# Patient Record
Sex: Male | Born: 1945 | Race: White | Hispanic: No | Smoking: Current every day smoker
Health system: Southern US, Community
[De-identification: ages and names within clinical notes are randomized; demographics above are authoritative.]

## PROBLEM LIST (undated history)

## (undated) DIAGNOSIS — Z972 Presence of dental prosthetic device (complete) (partial): Secondary | ICD-10-CM

## (undated) DIAGNOSIS — K08109 Complete loss of teeth, unspecified cause, unspecified class: Secondary | ICD-10-CM

## (undated) DIAGNOSIS — R351 Nocturia: Secondary | ICD-10-CM

## (undated) DIAGNOSIS — C189 Malignant neoplasm of colon, unspecified: Secondary | ICD-10-CM

## (undated) DIAGNOSIS — N471 Phimosis: Secondary | ICD-10-CM

## (undated) DIAGNOSIS — E785 Hyperlipidemia, unspecified: Secondary | ICD-10-CM

## (undated) DIAGNOSIS — J439 Emphysema, unspecified: Secondary | ICD-10-CM

## (undated) DIAGNOSIS — R350 Frequency of micturition: Secondary | ICD-10-CM

## (undated) DIAGNOSIS — J302 Other seasonal allergic rhinitis: Secondary | ICD-10-CM

## (undated) HISTORY — PX: COLONOSCOPY: SHX174

## (undated) HISTORY — DX: Hyperlipidemia, unspecified: E78.5

---

## 1966-08-07 HISTORY — PX: WRIST SURGERY: SHX841

## 2005-06-29 ENCOUNTER — Ambulatory Visit (HOSPITAL_COMMUNITY): Admission: RE | Admit: 2005-06-29 | Discharge: 2005-06-29 | Payer: Self-pay | Admitting: Family Medicine

## 2009-02-27 ENCOUNTER — Inpatient Hospital Stay (HOSPITAL_COMMUNITY): Admission: EM | Admit: 2009-02-27 | Discharge: 2009-03-05 | Payer: Self-pay | Admitting: Emergency Medicine

## 2009-02-28 ENCOUNTER — Encounter (INDEPENDENT_AMBULATORY_CARE_PROVIDER_SITE_OTHER): Payer: Self-pay | Admitting: General Surgery

## 2009-02-28 HISTORY — PX: RIGHT COLECTOMY: SHX853

## 2009-03-07 ENCOUNTER — Emergency Department (HOSPITAL_COMMUNITY): Admission: EM | Admit: 2009-03-07 | Discharge: 2009-03-07 | Payer: Self-pay | Admitting: Emergency Medicine

## 2009-03-18 ENCOUNTER — Ambulatory Visit: Payer: Self-pay | Admitting: Oncology

## 2009-03-25 LAB — CBC WITH DIFFERENTIAL/PLATELET
Eosinophils Absolute: 0.3 10*3/uL (ref 0.0–0.5)
HCT: 34.6 % — ABNORMAL LOW (ref 38.4–49.9)
LYMPH%: 23 % (ref 14.0–49.0)
MCV: 87.1 fL (ref 79.3–98.0)
MONO%: 9.3 % (ref 0.0–14.0)
NEUT#: 3.3 10*3/uL (ref 1.5–6.5)
NEUT%: 61.7 % (ref 39.0–75.0)
Platelets: 307 10*3/uL (ref 140–400)
RBC: 3.98 10*6/uL — ABNORMAL LOW (ref 4.20–5.82)

## 2009-03-25 LAB — COMPREHENSIVE METABOLIC PANEL
Alkaline Phosphatase: 66 U/L (ref 39–117)
BUN: 12 mg/dL (ref 6–23)
Creatinine, Ser: 0.7 mg/dL (ref 0.40–1.50)
Glucose, Bld: 94 mg/dL (ref 70–99)
Sodium: 134 mEq/L — ABNORMAL LOW (ref 135–145)
Total Bilirubin: 0.4 mg/dL (ref 0.3–1.2)
Total Protein: 7 g/dL (ref 6.0–8.3)

## 2009-04-02 ENCOUNTER — Ambulatory Visit (HOSPITAL_COMMUNITY): Admission: RE | Admit: 2009-04-02 | Discharge: 2009-04-02 | Payer: Self-pay | Admitting: General Surgery

## 2009-04-02 HISTORY — PX: OTHER SURGICAL HISTORY: SHX169

## 2009-04-07 LAB — CBC WITH DIFFERENTIAL/PLATELET
BASO%: 0.2 % (ref 0.0–2.0)
HCT: 36.5 % — ABNORMAL LOW (ref 38.4–49.9)
MCHC: 34.5 g/dL (ref 32.0–36.0)
MONO#: 0.4 10*3/uL (ref 0.1–0.9)
RBC: 4.29 10*6/uL (ref 4.20–5.82)
WBC: 4.7 10*3/uL (ref 4.0–10.3)
lymph#: 1.3 10*3/uL (ref 0.9–3.3)

## 2009-04-17 ENCOUNTER — Ambulatory Visit: Payer: Self-pay | Admitting: Oncology

## 2009-04-21 LAB — COMPREHENSIVE METABOLIC PANEL
ALT: 11 U/L (ref 0–53)
CO2: 27 mEq/L (ref 19–32)
Calcium: 8.8 mg/dL (ref 8.4–10.5)
Chloride: 103 mEq/L (ref 96–112)
Potassium: 3.6 mEq/L (ref 3.5–5.3)
Sodium: 135 mEq/L (ref 135–145)
Total Protein: 6.7 g/dL (ref 6.0–8.3)

## 2009-04-21 LAB — CBC WITH DIFFERENTIAL/PLATELET
BASO%: 0.3 % (ref 0.0–2.0)
EOS%: 4.5 % (ref 0.0–7.0)
LYMPH%: 28.3 % (ref 14.0–49.0)
MCHC: 34.9 g/dL (ref 32.0–36.0)
MONO#: 0.5 10*3/uL (ref 0.1–0.9)
RBC: 4.42 10*6/uL (ref 4.20–5.82)
WBC: 4 10*3/uL (ref 4.0–10.3)
lymph#: 1.1 10*3/uL (ref 0.9–3.3)

## 2009-05-05 LAB — COMPREHENSIVE METABOLIC PANEL
ALT: 15 U/L (ref 0–53)
AST: 26 U/L (ref 0–37)
Alkaline Phosphatase: 57 U/L (ref 39–117)
Calcium: 9 mg/dL (ref 8.4–10.5)
Chloride: 102 mEq/L (ref 96–112)
Creatinine, Ser: 0.66 mg/dL (ref 0.40–1.50)
Potassium: 3.7 mEq/L (ref 3.5–5.3)

## 2009-05-05 LAB — CBC WITH DIFFERENTIAL/PLATELET
BASO%: 0.4 % (ref 0.0–2.0)
EOS%: 3.3 % (ref 0.0–7.0)
MCH: 30.8 pg (ref 27.2–33.4)
MCHC: 35.1 g/dL (ref 32.0–36.0)
MONO%: 14.6 % — ABNORMAL HIGH (ref 0.0–14.0)
NEUT%: 55.3 % (ref 39.0–75.0)
RDW: 15.6 % — ABNORMAL HIGH (ref 11.0–14.6)
lymph#: 1 10*3/uL (ref 0.9–3.3)

## 2009-05-15 ENCOUNTER — Ambulatory Visit: Payer: Self-pay | Admitting: Oncology

## 2009-05-19 LAB — CBC WITH DIFFERENTIAL/PLATELET
Basophils Absolute: 0 10*3/uL (ref 0.0–0.1)
EOS%: 3.5 % (ref 0.0–7.0)
MCH: 30.1 pg (ref 27.2–33.4)
MCHC: 34.5 g/dL (ref 32.0–36.0)
MCV: 87.3 fL (ref 79.3–98.0)
MONO%: 15.1 % — ABNORMAL HIGH (ref 0.0–14.0)
RBC: 4.25 10*6/uL (ref 4.20–5.82)
RDW: 16.1 % — ABNORMAL HIGH (ref 11.0–14.6)

## 2009-05-26 LAB — CBC WITH DIFFERENTIAL/PLATELET
BASO%: 0.6 % (ref 0.0–2.0)
Basophils Absolute: 0 10e3/uL (ref 0.0–0.1)
EOS%: 2.6 % (ref 0.0–7.0)
Eosinophils Absolute: 0.1 10e3/uL (ref 0.0–0.5)
HCT: 37.9 % — ABNORMAL LOW (ref 38.4–49.9)
HGB: 13.2 g/dL (ref 13.0–17.1)
LYMPH%: 35.7 % (ref 14.0–49.0)
MCH: 30.6 pg (ref 27.2–33.4)
MCHC: 34.8 g/dL (ref 32.0–36.0)
MCV: 87.9 fL (ref 79.3–98.0)
MONO#: 0.5 10e3/uL (ref 0.1–0.9)
MONO%: 15.5 % — ABNORMAL HIGH (ref 0.0–14.0)
NEUT#: 1.6 10e3/uL (ref 1.5–6.5)
NEUT%: 45.6 % (ref 39.0–75.0)
Platelets: 81 10e3/uL — ABNORMAL LOW (ref 140–400)
RBC: 4.31 10e6/uL (ref 4.20–5.82)
RDW: 16.5 % — ABNORMAL HIGH (ref 11.0–14.6)
WBC: 3.4 10e3/uL — ABNORMAL LOW (ref 4.0–10.3)
lymph#: 1.2 10e3/uL (ref 0.9–3.3)
nRBC: 0 % (ref 0–0)

## 2009-05-28 HISTORY — PX: PORTA CATH REMOVAL: CATH118286

## 2009-06-03 LAB — COMPREHENSIVE METABOLIC PANEL
AST: 19 U/L (ref 0–37)
Albumin: 3.6 g/dL (ref 3.5–5.2)
BUN: 7 mg/dL (ref 6–23)
Calcium: 9.3 mg/dL (ref 8.4–10.5)
Chloride: 106 mEq/L (ref 96–112)
Potassium: 3.8 mEq/L (ref 3.5–5.3)

## 2009-06-03 LAB — CBC WITH DIFFERENTIAL/PLATELET
BASO%: 0.2 % (ref 0.0–2.0)
Basophils Absolute: 0 10*3/uL (ref 0.0–0.1)
EOS%: 2.4 % (ref 0.0–7.0)
HCT: 38.8 % (ref 38.4–49.9)
HGB: 13.4 g/dL (ref 13.0–17.1)
MCH: 30.6 pg (ref 27.2–33.4)
MONO#: 0.6 10*3/uL (ref 0.1–0.9)
RDW: 15.7 % — ABNORMAL HIGH (ref 11.0–14.6)
WBC: 4.9 10*3/uL (ref 4.0–10.3)
lymph#: 1.1 10*3/uL (ref 0.9–3.3)

## 2009-06-16 ENCOUNTER — Ambulatory Visit: Payer: Self-pay | Admitting: Oncology

## 2009-06-16 LAB — CBC WITH DIFFERENTIAL/PLATELET
BASO%: 0.2 % (ref 0.0–2.0)
EOS%: 4.8 % (ref 0.0–7.0)
HCT: 38.5 % (ref 38.4–49.9)
LYMPH%: 30 % (ref 14.0–49.0)
MCH: 30.7 pg (ref 27.2–33.4)
MCHC: 34.8 g/dL (ref 32.0–36.0)
MCV: 88.3 fL (ref 79.3–98.0)
MONO%: 15.2 % — ABNORMAL HIGH (ref 0.0–14.0)
NEUT%: 49.8 % (ref 39.0–75.0)
Platelets: 89 10*3/uL — ABNORMAL LOW (ref 140–400)

## 2009-06-16 LAB — COMPREHENSIVE METABOLIC PANEL
ALT: 11 U/L (ref 0–53)
AST: 21 U/L (ref 0–37)
Calcium: 9 mg/dL (ref 8.4–10.5)
Chloride: 104 mEq/L (ref 96–112)
Creatinine, Ser: 0.67 mg/dL (ref 0.40–1.50)
Total Bilirubin: 0.4 mg/dL (ref 0.3–1.2)

## 2009-06-30 LAB — CBC WITH DIFFERENTIAL/PLATELET
Eosinophils Absolute: 0.1 10*3/uL (ref 0.0–0.5)
HCT: 38.9 % (ref 38.4–49.9)
LYMPH%: 21.4 % (ref 14.0–49.0)
MCHC: 35.2 g/dL (ref 32.0–36.0)
MCV: 89.4 fL (ref 79.3–98.0)
MONO#: 0.7 10*3/uL (ref 0.1–0.9)
MONO%: 14.5 % — ABNORMAL HIGH (ref 0.0–14.0)
NEUT%: 61.7 % (ref 39.0–75.0)
Platelets: 63 10*3/uL — ABNORMAL LOW (ref 140–400)
RBC: 4.35 10*6/uL (ref 4.20–5.82)
nRBC: 0 % (ref 0–0)

## 2009-06-30 LAB — COMPREHENSIVE METABOLIC PANEL
ALT: 16 U/L (ref 0–53)
BUN: 5 mg/dL — ABNORMAL LOW (ref 6–23)
CO2: 27 mEq/L (ref 19–32)
Calcium: 9.2 mg/dL (ref 8.4–10.5)
Chloride: 100 mEq/L (ref 96–112)
Creatinine, Ser: 0.63 mg/dL (ref 0.40–1.50)
Total Bilirubin: 0.6 mg/dL (ref 0.3–1.2)

## 2009-07-14 ENCOUNTER — Ambulatory Visit: Payer: Self-pay | Admitting: Oncology

## 2009-07-14 LAB — COMPREHENSIVE METABOLIC PANEL
AST: 25 U/L (ref 0–37)
Albumin: 3.8 g/dL (ref 3.5–5.2)
BUN: 6 mg/dL (ref 6–23)
CO2: 26 mEq/L (ref 19–32)
Calcium: 9.3 mg/dL (ref 8.4–10.5)
Chloride: 102 mEq/L (ref 96–112)
Creatinine, Ser: 0.63 mg/dL (ref 0.40–1.50)
Glucose, Bld: 86 mg/dL (ref 70–99)
Potassium: 3.9 mEq/L (ref 3.5–5.3)

## 2009-07-14 LAB — CBC WITH DIFFERENTIAL/PLATELET
BASO%: 0.3 % (ref 0.0–2.0)
Basophils Absolute: 0 10*3/uL (ref 0.0–0.1)
Eosinophils Absolute: 0.1 10*3/uL (ref 0.0–0.5)
HCT: 41 % (ref 38.4–49.9)
HGB: 14.2 g/dL (ref 13.0–17.1)
LYMPH%: 16.3 % (ref 14.0–49.0)
MCHC: 34.6 g/dL (ref 32.0–36.0)
MONO#: 0.8 10*3/uL (ref 0.1–0.9)
NEUT#: 5 10*3/uL (ref 1.5–6.5)
NEUT%: 70.8 % (ref 39.0–75.0)
Platelets: 74 10*3/uL — ABNORMAL LOW (ref 140–400)
WBC: 7 10*3/uL (ref 4.0–10.3)
lymph#: 1.1 10*3/uL (ref 0.9–3.3)

## 2009-07-28 LAB — COMPREHENSIVE METABOLIC PANEL
Albumin: 3.8 g/dL (ref 3.5–5.2)
BUN: 7 mg/dL (ref 6–23)
CO2: 27 mEq/L (ref 19–32)
Glucose, Bld: 98 mg/dL (ref 70–99)
Sodium: 134 mEq/L — ABNORMAL LOW (ref 135–145)
Total Bilirubin: 0.8 mg/dL (ref 0.3–1.2)
Total Protein: 6.9 g/dL (ref 6.0–8.3)

## 2009-07-28 LAB — CBC WITH DIFFERENTIAL/PLATELET
Eosinophils Absolute: 0.2 10*3/uL (ref 0.0–0.5)
HGB: 14.2 g/dL (ref 13.0–17.1)
MONO#: 0.7 10*3/uL (ref 0.1–0.9)
NEUT#: 2.6 10*3/uL (ref 1.5–6.5)
RBC: 4.38 10*6/uL (ref 4.20–5.82)
RDW: 16 % — ABNORMAL HIGH (ref 11.0–14.6)
WBC: 4.6 10*3/uL (ref 4.0–10.3)

## 2009-08-07 ENCOUNTER — Ambulatory Visit: Payer: Self-pay | Admitting: Oncology

## 2009-08-11 LAB — COMPREHENSIVE METABOLIC PANEL
CO2: 28 mEq/L (ref 19–32)
Creatinine, Ser: 0.74 mg/dL (ref 0.40–1.50)
Glucose, Bld: 111 mg/dL — ABNORMAL HIGH (ref 70–99)
Sodium: 135 mEq/L (ref 135–145)
Total Bilirubin: 0.7 mg/dL (ref 0.3–1.2)
Total Protein: 6.7 g/dL (ref 6.0–8.3)

## 2009-08-11 LAB — CBC WITH DIFFERENTIAL/PLATELET
Basophils Absolute: 0 10*3/uL (ref 0.0–0.1)
Eosinophils Absolute: 0.1 10*3/uL (ref 0.0–0.5)
HCT: 40.6 % (ref 38.4–49.9)
HGB: 14.1 g/dL (ref 13.0–17.1)
LYMPH%: 28.7 % (ref 14.0–49.0)
MONO#: 0.5 10*3/uL (ref 0.1–0.9)
NEUT#: 2.1 10*3/uL (ref 1.5–6.5)
NEUT%: 55.8 % (ref 39.0–75.0)
Platelets: 82 10*3/uL — ABNORMAL LOW (ref 140–400)
WBC: 3.8 10*3/uL — ABNORMAL LOW (ref 4.0–10.3)

## 2009-08-25 LAB — CBC WITH DIFFERENTIAL/PLATELET
Eosinophils Absolute: 0.1 10*3/uL (ref 0.0–0.5)
LYMPH%: 15 % (ref 14.0–49.0)
MONO#: 1 10*3/uL — ABNORMAL HIGH (ref 0.1–0.9)
NEUT#: 4.3 10*3/uL (ref 1.5–6.5)
Platelets: 104 10*3/uL — ABNORMAL LOW (ref 140–400)
RBC: 3.72 10*6/uL — ABNORMAL LOW (ref 4.20–5.82)
WBC: 6.4 10*3/uL (ref 4.0–10.3)
nRBC: 0 % (ref 0–0)

## 2009-08-25 LAB — COMPREHENSIVE METABOLIC PANEL
ALT: 11 U/L (ref 0–53)
Albumin: 3.2 g/dL — ABNORMAL LOW (ref 3.5–5.2)
CO2: 27 mEq/L (ref 19–32)
Calcium: 8.7 mg/dL (ref 8.4–10.5)
Chloride: 99 mEq/L (ref 96–112)
Creatinine, Ser: 0.58 mg/dL (ref 0.40–1.50)
Potassium: 3.3 mEq/L — ABNORMAL LOW (ref 3.5–5.3)
Sodium: 133 mEq/L — ABNORMAL LOW (ref 135–145)
Total Protein: 7 g/dL (ref 6.0–8.3)

## 2009-09-08 ENCOUNTER — Ambulatory Visit: Payer: Self-pay | Admitting: Oncology

## 2009-09-08 LAB — CBC WITH DIFFERENTIAL/PLATELET
Basophils Absolute: 0 10*3/uL (ref 0.0–0.1)
Eosinophils Absolute: 0.1 10*3/uL (ref 0.0–0.5)
HCT: 39.5 % (ref 38.4–49.9)
LYMPH%: 15.9 % (ref 14.0–49.0)
MCV: 94.7 fL (ref 79.3–98.0)
MONO#: 1 10*3/uL — ABNORMAL HIGH (ref 0.1–0.9)
MONO%: 15 % — ABNORMAL HIGH (ref 0.0–14.0)
NEUT#: 4.6 10*3/uL (ref 1.5–6.5)
NEUT%: 67.4 % (ref 39.0–75.0)
Platelets: 112 10*3/uL — ABNORMAL LOW (ref 140–400)
RBC: 4.17 10*6/uL — ABNORMAL LOW (ref 4.20–5.82)
WBC: 6.8 10*3/uL (ref 4.0–10.3)

## 2009-09-08 LAB — COMPREHENSIVE METABOLIC PANEL
Alkaline Phosphatase: 77 U/L (ref 39–117)
CO2: 28 mEq/L (ref 19–32)
Creatinine, Ser: 0.67 mg/dL (ref 0.40–1.50)
Glucose, Bld: 98 mg/dL (ref 70–99)
Sodium: 130 mEq/L — ABNORMAL LOW (ref 135–145)
Total Bilirubin: 0.8 mg/dL (ref 0.3–1.2)
Total Protein: 7.2 g/dL (ref 6.0–8.3)

## 2009-09-22 LAB — CBC WITH DIFFERENTIAL/PLATELET
BASO%: 0.4 % (ref 0.0–2.0)
EOS%: 3.9 % (ref 0.0–7.0)
HCT: 39.1 % (ref 38.4–49.9)
LYMPH%: 21.5 % (ref 14.0–49.0)
MCH: 32.9 pg (ref 27.2–33.4)
MCHC: 34.3 g/dL (ref 32.0–36.0)
MCV: 96.1 fL (ref 79.3–98.0)
MONO#: 0.5 10*3/uL (ref 0.1–0.9)
MONO%: 8.8 % (ref 0.0–14.0)
NEUT%: 65.4 % (ref 39.0–75.0)
Platelets: 93 10*3/uL — ABNORMAL LOW (ref 140–400)
RBC: 4.07 10*6/uL — ABNORMAL LOW (ref 4.20–5.82)

## 2009-09-22 LAB — COMPREHENSIVE METABOLIC PANEL
ALT: 9 U/L (ref 0–53)
Alkaline Phosphatase: 77 U/L (ref 39–117)
CO2: 27 mEq/L (ref 19–32)
Creatinine, Ser: 0.64 mg/dL (ref 0.40–1.50)
Sodium: 134 mEq/L — ABNORMAL LOW (ref 135–145)
Total Bilirubin: 0.7 mg/dL (ref 0.3–1.2)

## 2009-10-30 ENCOUNTER — Ambulatory Visit: Payer: Self-pay | Admitting: Oncology

## 2009-11-03 LAB — CBC WITH DIFFERENTIAL/PLATELET
BASO%: 0.8 % (ref 0.0–2.0)
EOS%: 2.8 % (ref 0.0–7.0)
MCH: 33.6 pg — ABNORMAL HIGH (ref 27.2–33.4)
MCHC: 35 g/dL (ref 32.0–36.0)
RDW: 14.1 % (ref 11.0–14.6)
lymph#: 1.2 10*3/uL (ref 0.9–3.3)

## 2009-11-11 ENCOUNTER — Encounter: Admission: RE | Admit: 2009-11-11 | Discharge: 2009-11-11 | Payer: Self-pay | Admitting: General Surgery

## 2010-02-20 ENCOUNTER — Ambulatory Visit: Payer: Self-pay | Admitting: Oncology

## 2010-02-24 ENCOUNTER — Ambulatory Visit (HOSPITAL_COMMUNITY): Admission: RE | Admit: 2010-02-24 | Discharge: 2010-02-24 | Payer: Self-pay | Admitting: Oncology

## 2010-02-24 LAB — CBC WITH DIFFERENTIAL/PLATELET
BASO%: 1 % (ref 0.0–2.0)
Eosinophils Absolute: 0.1 10*3/uL (ref 0.0–0.5)
HCT: 42.2 % (ref 38.4–49.9)
HGB: 15 g/dL (ref 13.0–17.1)
MCHC: 35.5 g/dL (ref 32.0–36.0)
MONO#: 0.4 10*3/uL (ref 0.1–0.9)
NEUT#: 3 10*3/uL (ref 1.5–6.5)
NEUT%: 64.5 % (ref 39.0–75.0)
WBC: 4.6 10*3/uL (ref 4.0–10.3)
lymph#: 1.1 10*3/uL (ref 0.9–3.3)

## 2010-02-24 LAB — COMPREHENSIVE METABOLIC PANEL
ALT: 10 U/L (ref 0–53)
CO2: 28 mEq/L (ref 19–32)
Calcium: 9.1 mg/dL (ref 8.4–10.5)
Chloride: 103 mEq/L (ref 96–112)
Creatinine, Ser: 0.86 mg/dL (ref 0.40–1.50)
Total Protein: 7.4 g/dL (ref 6.0–8.3)

## 2010-02-24 LAB — CEA: CEA: 2.7 ng/mL (ref 0.0–5.0)

## 2010-04-17 ENCOUNTER — Encounter (INDEPENDENT_AMBULATORY_CARE_PROVIDER_SITE_OTHER): Payer: Self-pay | Admitting: *Deleted

## 2010-04-20 ENCOUNTER — Ambulatory Visit: Payer: Self-pay | Admitting: Gastroenterology

## 2010-05-04 ENCOUNTER — Ambulatory Visit: Payer: Self-pay | Admitting: Gastroenterology

## 2010-05-06 ENCOUNTER — Encounter: Payer: Self-pay | Admitting: Gastroenterology

## 2010-08-16 IMAGING — CT CT PELVIS W/ CM
1 of 2 series · 14 of 32 positions shown, 18 images · IV contrast (agent unspecified)
Comparison: Acute abdominal series 02/27/2009

CT ABDOMEN

CLINICAL DATA: Small bowel obstruction and abdominal pain

CT ABDOMEN AND PELVIS WITH CONTRAST
TECHNIQUE: Multidetector CT imaging of the abdomen and pelvis was
performed using the standard protocol following bolus
administration of intravenous contrast.
Contrast: 100 ml Umnipaque-KAA

[Series 2: rtn ap with st · axial · 0.67mm/px · z∈[-540,-110]mm · 14 of 96 slices shown, 18 images]
[im 5/96  soft-tissue]
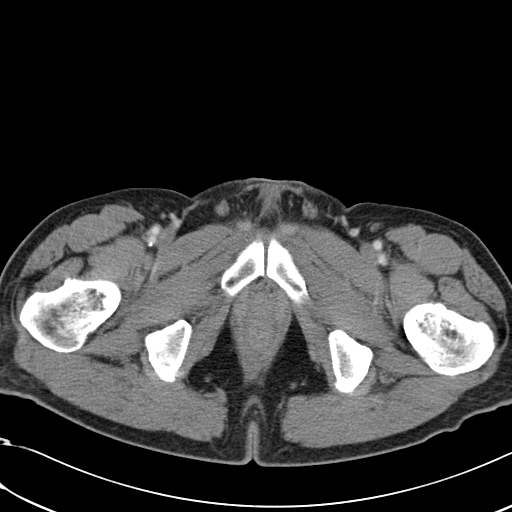
[im 5/96  bone]
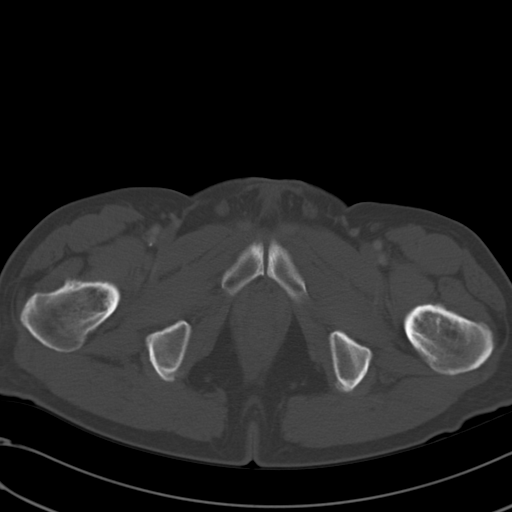
[im 13/96  soft-tissue]
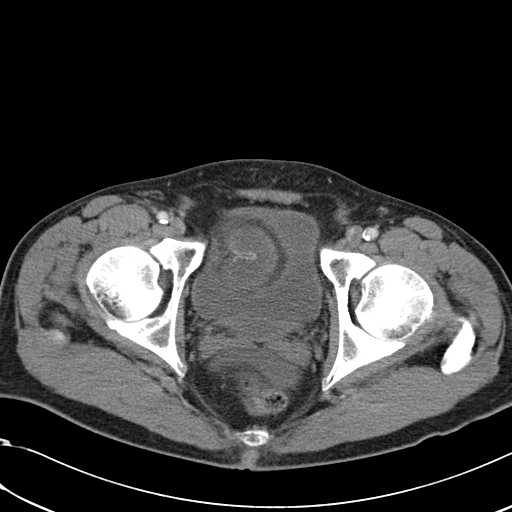
[im 22/96  soft-tissue]
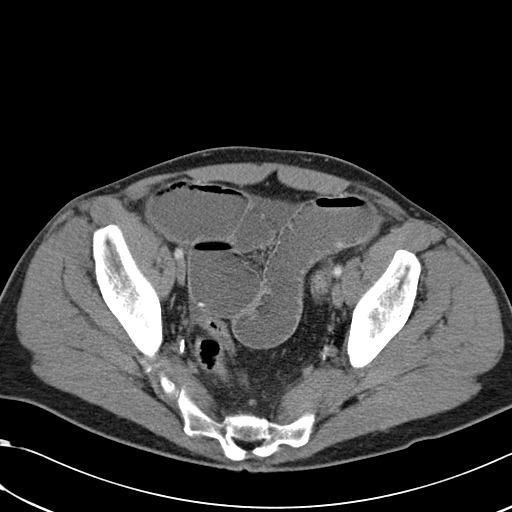
[im 31/96  soft-tissue]
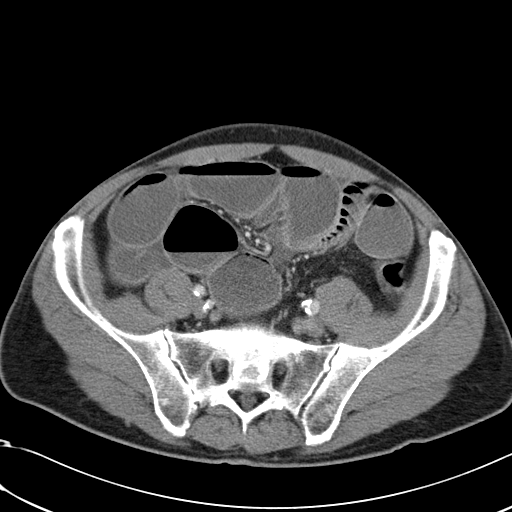
[im 35/96  soft-tissue]
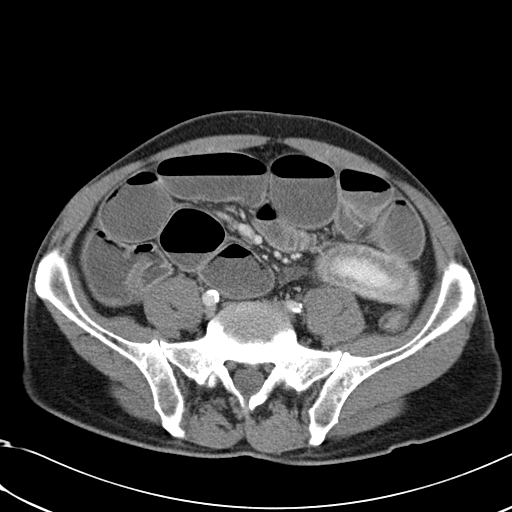
[im 44/96  soft-tissue]
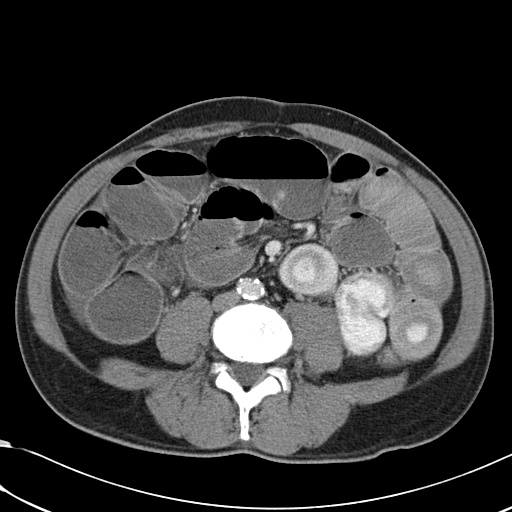
[im 52/96  soft-tissue]
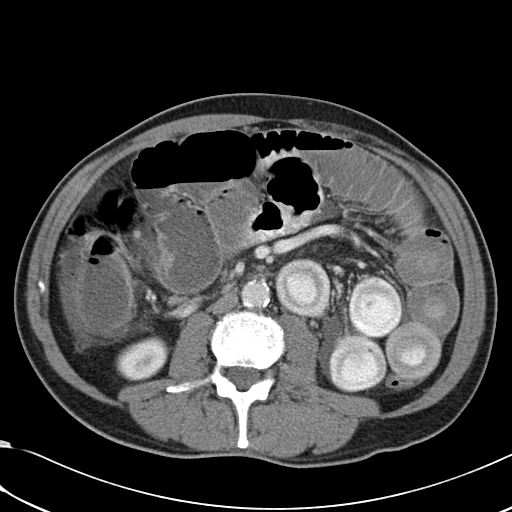
[im 61/96  soft-tissue]
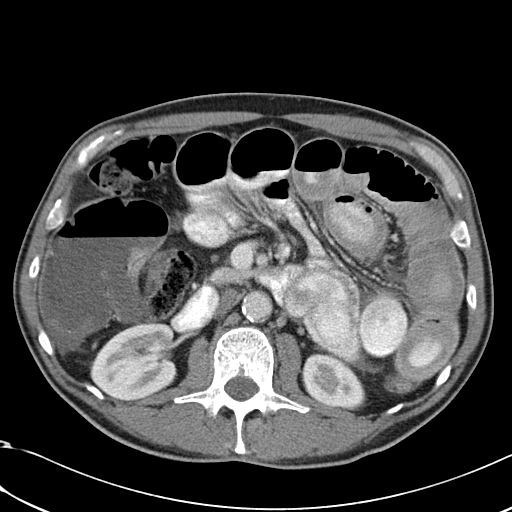
[im 65/96  soft-tissue]
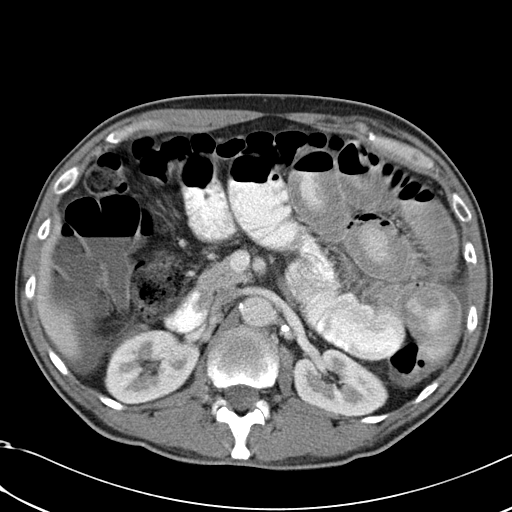
[im 65/96  bone]
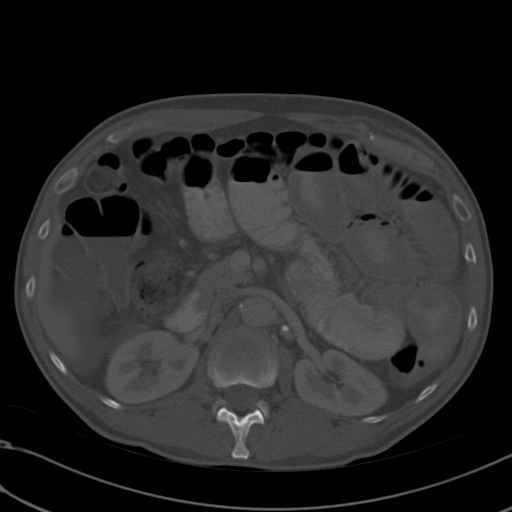
[im 74/96  soft-tissue]
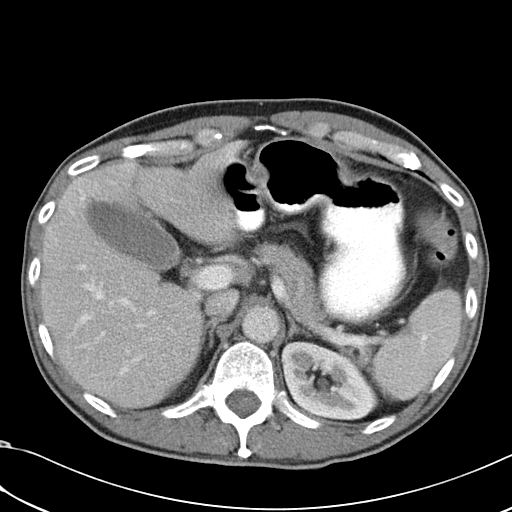
[im 78/96  lung]
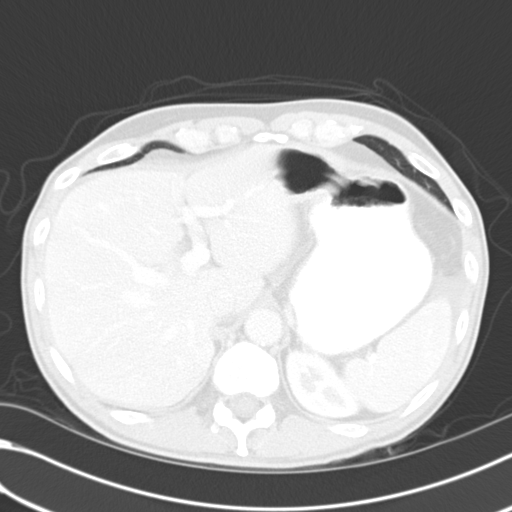
[im 83/96  soft-tissue]
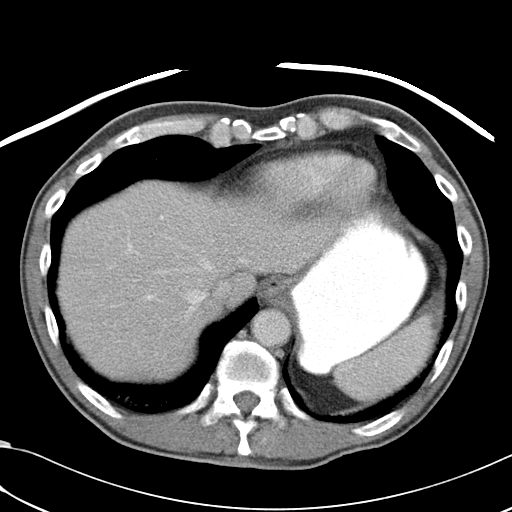
[im 83/96  lung]
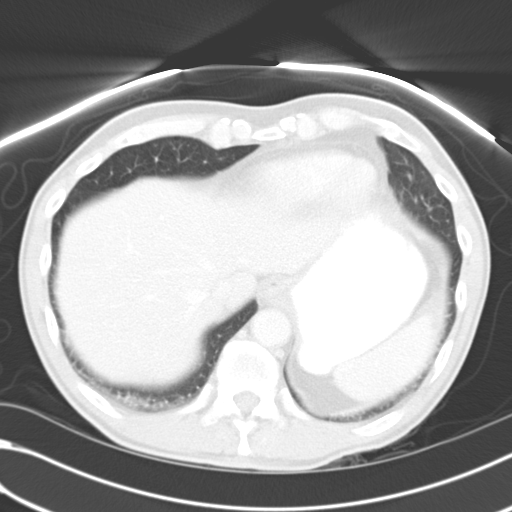
[im 87/96  lung]
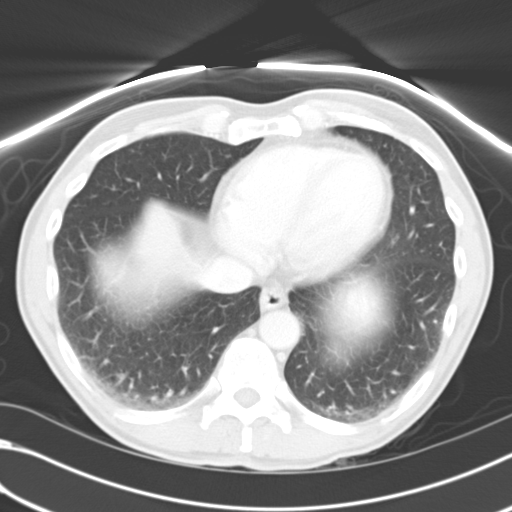
[im 91/96  soft-tissue]
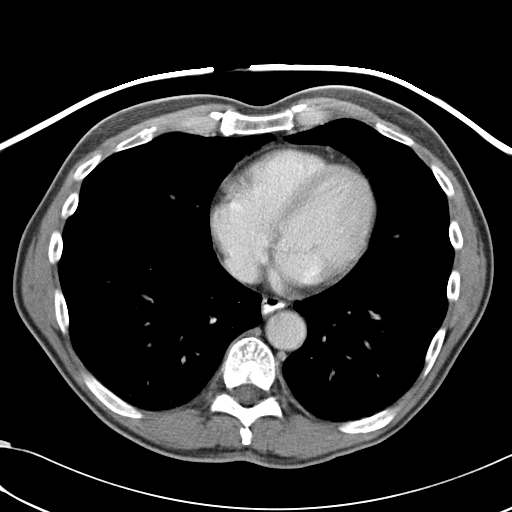
[im 91/96  lung]
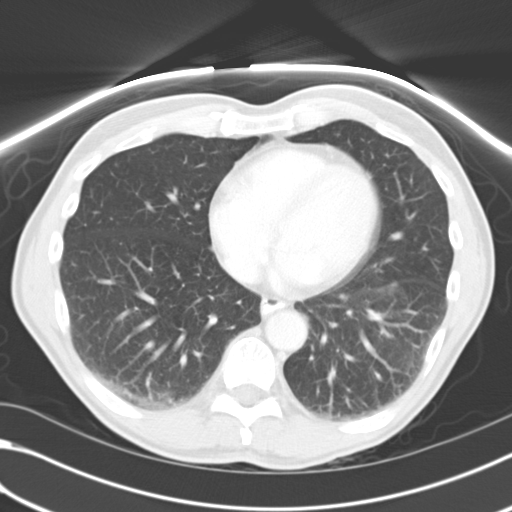

[14 of 32 positions shown; findings below may reference images not displayed]

FINDINGS: Heart size is normal.  There is dependent atelectasis in
the lung bases.  No pleural effusion or pulmonary nodule is
identified at the lung bases.

There is a high-grade small bowel and proximal colonic obstruction,
with a transition point in the ascending colon near the hepatic
flexure where there is a concentric mass measuring approximately
2.2 x 2.5 cm axial diameter, highly concerning for colon carcinoma.
Distal to this mass, the colon is within normal limits for caliber.
Small bowel loops are fluid-filled and measure up to 4.6 cm in the
right abdomen.  The cecum and dilated portion of the ascending
colon are fluid filled.

In the mesenteries of the upper abdomen, medial to the colonic mass
is 95 mm lymph node (image #36 of series 2).  Just medial to this,
and adjacent to the superior mesenteric vein is a 5.5 mm lymph node
(image #37 of series 2).  Several small lymph nodes are also
identified in the right abdomen, inferior to the colonic mass where
there are is a 4 mm lymph node (image #44 of series 2 )and a 5 mm
lymph node image #45 of series 2).  These may be reactive, or may
be involved with metastatic disease.  There is a small amount of
ascites in the right upper quadrant adjacent to the liver, and in
Morison's pouch.

No enlarged retroperitoneal lymph nodes are identified.

The liver, gallbladder, pancreas, spleen, and kidneys are within
normal limits.  There is no hydronephrosis.  On delayed images both
kidneys excrete contrast symmetrically.  The

No lytic or sclerotic osseous lesions are identified.
IMPRESSION: 1.  High-grade small bowel and proximal colonic obstruction,
secondary to a concentric colonic mass in the ascending colon,
highly worrisome for colon carcinoma.  Findings were discussed by
telephone with Dr. Rajpoot.
2.  There are multiple small (4-6 mm) lymph nodes within the
mesenteries of the right abdomen for which metastatic involvement
cannot be excluded.  Alternatively, these could be reactive lymph
nodes.
3.  Small amount of ascites.

CT PELVIS
FINDINGS: There markedly dilated pelvic small bowel loops,
measuring up to 4.2 cm.  No evidence of small bowel wall
thickening.  The sigmoid colon and rectum are decompressed.  There
is a small volume of abdominal pelvic ascites.  Prostate gland is
mildly prominent.  Seminal vesicles unremarkable.  No inguinal or
pelvic sidewall lymphadenopathy is identified.  The urinary bladder
is unremarkable.  No lytic or sclerotic pelvic bony lesions.
IMPRESSION: Dilated pelvic small bowel loops consistent with high-grade bowel
obstruction as described above.
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
## 2010-08-25 ENCOUNTER — Ambulatory Visit: Payer: Self-pay | Admitting: Oncology

## 2010-08-27 ENCOUNTER — Encounter: Payer: Self-pay | Admitting: Gastroenterology

## 2010-08-27 LAB — CBC WITH DIFFERENTIAL/PLATELET
Basophils Absolute: 0 10*3/uL (ref 0.0–0.1)
EOS%: 2 % (ref 0.0–7.0)
Eosinophils Absolute: 0.1 10*3/uL (ref 0.0–0.5)
HGB: 15.4 g/dL (ref 13.0–17.1)
NEUT#: 4.6 10*3/uL (ref 1.5–6.5)
RDW: 13 % (ref 11.0–14.6)
lymph#: 1.2 10*3/uL (ref 0.9–3.3)

## 2010-10-16 ENCOUNTER — Other Ambulatory Visit: Payer: Self-pay | Admitting: Oncology

## 2010-10-16 DIAGNOSIS — C189 Malignant neoplasm of colon, unspecified: Secondary | ICD-10-CM

## 2010-10-27 NOTE — Letter (Signed)
Summary: Results Letter  [facility]  [address] [place], [place] [address]   Phone: [phone]  Fax: [phone]        May 06, 2010 MRN: [id number]    [facility] [address] [place], [place]  [address]    Dear Mr. Crisoforo Oxford,   Good news.  The polyp(s) that were removed during your recent procedure were NOT pre-cancerous.  However given your previous colon cancer, you will need a repeat colonoscopy in 3 years.  We will therefore put your information in our reminder system and will contact you in 3 years to schedule a repeat procedure.  Please call if you have any questions or concerns.        Sincerely,  Rachael Fee MD  This letter has been electronically signed by your physician.  Appended Document: Results Letter letter mailed

## 2010-10-27 NOTE — Letter (Signed)
Summary: [facility] Instructions  [facility]  [address] [place], [place] [address]   Phone: [phone]  Fax: [phone]       Tim Obrien    10-May-1946    MRN: [id number]        Procedure Day /Date:  05/04/10 Monday     Arrival Time:  8:30am      Procedure Time:  9:30am     Location of Procedure:                    _ x_   [facility]oscopy Center (4th Floor)                        PREPARATION FOR COLONOSCOPY WITH MOVIPREP   Starting 5 days prior to your procedure _ 8/3/11_ do not eat nuts, seeds, popcorn, corn, beans, peas,  salads, or any raw vegetables.  Do not take any fiber supplements (e.g. Metamucil, Citrucel, and Benefiber).  THE DAY BEFORE YOUR PROCEDURE         DATE:   05/03/10  DAY:  Sunday  1.  Drink clear liquids the entire day-NO SOLID FOOD  2.  Do not drink anything colored red or purple.  Avoid juices with pulp.  No orange juice.  3.  Drink at least 64 oz. (8 glasses) of fluid/clear liquids during the day to prevent dehydration and help the prep work efficiently.  CLEAR LIQUIDS INCLUDE: Water Jello Ice Popsicles Tea (sugar ok, no milk/cream) Powdered fruit flavored drinks Coffee (sugar ok, no milk/cream) Gatorade Juice: apple, white grape, white cranberry  Lemonade Clear bullion, consomm, broth Carbonated beverages (any kind) Strained chicken noodle soup Hard Candy                             4.  In the morning, mix first dose of MoviPrep solution:    Empty 1 Pouch A and 1 Pouch B into the disposable container    Add lukewarm drinking water to the top line of the container. Mix to dissolve    Refrigerate (mixed solution should be used within 24 hrs)  5.  Begin drinking the prep at 5:00 p.m. The MoviPrep container is divided by 4 marks.   Every 15 minutes drink the solution down to the next mark (approximately 8 oz) until the full liter is complete.   6.  Follow completed prep with 16 oz of clear liquid of your choice  (Nothing red or purple).  Continue to drink clear liquids until bedtime.  7.  Before going to bed, mix second dose of MoviPrep solution:    Empty 1 Pouch A and 1 Pouch B into the disposable container    Add lukewarm drinking water to the top line of the container. Mix to dissolve    Refrigerate  THE DAY OF YOUR PROCEDURE      DATE:  05/04/10 DAY:  Monday  Beginning at  4:30 a.m. (5 hours before procedure):         1. Every 15 minutes, drink the solution down to the next mark (approx 8 oz) until the full liter is complete.  2. Follow completed prep with 16 oz. of clear liquid of your choice.    3. You may drink clear liquids until  7:30am  (2 HOURS BEFORE PROCEDURE).   MEDICATION INSTRUCTIONS  Unless otherwise instructed, you should take regular prescription medications with a small sip of  water   as early as possible the morning of your procedure.           OTHER INSTRUCTIONS  You will need a responsible adult at least 64 years of age to accompany you and drive you home.   This person must remain in the waiting room during your procedure.  Wear loose fitting clothing that is easily removed.  Leave jewelry and other valuables at home.  However, you may wish to bring a book to read or  an iPod/MP3 player to listen to music as you wait for your procedure to start.  Remove all body piercing jewelry and leave at home.  Total time from sign-in until discharge is approximately 2-3 hours.  You should go home directly after your procedure and rest.  You can resume normal activities the  day after your procedure.  The day of your procedure you should not:   Drive   Make legal decisions   Operate machinery   Drink alcohol   Return to work  You will receive specific instructions about eating, activities and medications before you leave.    The above instructions have been reviewed and explained to me by   Clide Cliff, RN_______________________    I fully  understand and can verbalize these instructions _____________________________ Date _________

## 2010-10-27 NOTE — Procedures (Signed)
Summary: Colonoscopy  Patient: Tim Obrien Note: All result statuses are Final unless otherwise noted.  Tests: (1) Colonoscopy (COL)   COL Colonoscopy           DONE     [facility]     [address]     [place], [place]  [address]           COLONOSCOPY PROCEDURE REPORT           PATIENT:  [name], [name]  MR#:  [id number]     BIRTHDATE:  1946-09-15, 64 yrs. old  GENDER:  male     ENDOSCOPIST:  Rachael Fee, MD     REF. BY:  Lavada Mesi. Truett Perna, M.D.     PROCEDURE DATE:  05/04/2010     PROCEDURE:  Colonoscopy with biopsy and snare polypectomy     ASA CLASS:  Class II     INDICATIONS:  T4 node +, right sided colon cancer resected June,     2010 by Dr. Dwain Sarna; chemo completed December 2010     MEDICATIONS:   Fentanyl 50 mcg IV, Versed 5 mg IV           DESCRIPTION OF PROCEDURE:   After the risks benefits and     alternatives of the procedure were thoroughly explained, informed     consent was obtained.  Digital rectal exam was performed and     revealed no rectal masses.   The LB PCF-H180AL [id number] endoscope     was introduced through the anus and advanced to the anastomosis,     without limitations.  The quality of the prep was good, using     MoviPrep.  The instrument was then slowly withdrawn as the colon     was fully examined.     <<PROCEDUREIMAGES>>           FINDINGS:  There were multiple small sessile polyps removed from     colon. These ranged in size from 3mm to 5mm. Many of them appeared     hyperplastic. They were removed with cold snare or biopsy forceps.     These were sent in 2 pathology jars (jar 1 contained 9 polyps, jar     2 contained a sampling of several small recto-sigmoid polyps that     were sampled) (see image5, image6, and image9).  Right     hemicolectomy anastomosis appeared normal (see image2 and image4).     This was otherwise a normal examination of the colon (see     image10).   Retroflexed views in the rectum revealed no  abnormalities.    The scope was then withdrawn from the patient     and the procedure completed.           COMPLICATIONS:  None     ENDOSCOPIC IMPRESSION:     1) Multiple small polyps, removed and sent to pathology.  Many     appeared hyperplastic     2) Normal right hemicolectomy     3) Otherwise normal examination           RECOMMENDATIONS:     1) You will receive a letter within 1-2 weeks with the results     of your biopsy as well as final recommendations. Please call my     office if you have not received a letter after 3 weeks.     2) Given your personal history of colon cancer, you will at least  need a colonoscopy in 3 years.           ______________________________     Rachael Fee, MD           cc: Harden Mo, MD           n.     eSIGNED:   Rachael Fee at 05/04/2010 10:05 AM           Jerelene Redden, [id number]  Note: An exclamation mark (!) indicates a result that was not dispersed into the flowsheet. Document Creation Date: 05/04/2010 10:07 AM _______________________________________________________________________  (1) Order result status: Final Collection or observation date-time: 05/04/2010 09:53 Requested date-time:  Receipt date-time:  Reported date-time:  Referring Physician:   Ordering Physician: Rob Bunting [phone]) Specimen Source:  Source: Launa Grill Order Number: [id number] Lab site:   Appended Document: Colonoscopy recall     Procedures Next Due Date:    Colonoscopy: 04/2013

## 2010-10-27 NOTE — Miscellaneous (Signed)
Summary: DIR COL...AS.  Clinical Lists Changes  Medications: Added new medication of MOVIPREP 100 GM  SOLR (PEG-KCL-NACL-NASULF-NA ASC-C) As directed - Signed Rx of MOVIPREP 100 GM  SOLR (PEG-KCL-NACL-NASULF-NA ASC-C) As directed;  #1 x 0;  Signed;  Entered by: Clide Cliff RN;  Authorized by: Rachael Fee MD;  Method used: [facility] [phone]*, [address] [place], [place], [place]  [address], Ph: [phone], Fax: [phone] Allergies: Added new allergy or adverse reaction of ASA Observations: Added new observation of NKA: F (04/20/2010 7:46)    Prescriptions: MOVIPREP 100 GM  SOLR (PEG-KCL-NACL-NASULF-NA ASC-C) As directed  #1 x 0   Entered by:   Clide Cliff RN   Authorized by:   Rachael Fee MD   Signed by:   Clide Cliff RN on 04/20/2010   Method used:   [facility] to        [facility] [phone][facility] (retail)       [address]       [place], [place]  [address]       Ph: [phone]       Fax: [phone]   RxID:   [id number]

## 2010-10-29 NOTE — Letter (Signed)
Summary: [facility]  [facility]   Imported By: Lester [place] 09/09/2010 09:02:19  _____________________________________________________________________  External Attachment:    Type:   Image     Comment:   External Document

## 2011-01-03 LAB — BASIC METABOLIC PANEL
CO2: 29 mEq/L (ref 19–32)
Calcium: 9.4 mg/dL (ref 8.4–10.5)
Chloride: 102 mEq/L (ref 96–112)
GFR calc Af Amer: >60 mL/min (ref >60)
Sodium: 137 mEq/L (ref 135–145)

## 2011-01-03 LAB — APTT: aPTT: 39 seconds — ABNORMAL HIGH (ref 24–37)

## 2011-01-03 LAB — DIFFERENTIAL
Basophils Relative: 1 % (ref 0–1)
Lymphs Abs: 1.1 10*3/uL (ref 0.7–4.0)
Monocytes Absolute: 0.4 10*3/uL (ref 0.1–1.0)
Monocytes Relative: 8 % (ref 3–12)
Neutro Abs: 3.2 10*3/uL (ref 1.7–7.7)

## 2011-01-03 LAB — CBC
Hemoglobin: 12.8 g/dL — ABNORMAL LOW (ref 13.0–17.0)
MCHC: 34 g/dL (ref 30.0–36.0)
RBC: 4.24 MIL/uL (ref 4.22–5.81)
WBC: 4.9 10*3/uL (ref 4.0–10.5)

## 2011-01-04 LAB — BASIC METABOLIC PANEL
BUN: 14 mg/dL (ref 6–23)
CO2: 28 mEq/L (ref 19–32)
Calcium: 8.4 mg/dL (ref 8.4–10.5)
Chloride: 95 mEq/L — ABNORMAL LOW (ref 96–112)
Creatinine, Ser: 0.93 mg/dL (ref 0.4–1.5)
GFR calc Af Amer: >60 mL/min (ref >60)

## 2011-01-04 LAB — CBC
HCT: 34.5 % — ABNORMAL LOW (ref 39.0–52.0)
HCT: 41.8 % (ref 39.0–52.0)
Hemoglobin: 15.7 g/dL (ref 13.0–17.0)
Hemoglobin: 16.8 g/dL (ref 13.0–17.0)
MCHC: 33.6 g/dL (ref 30.0–36.0)
MCHC: 34.2 g/dL (ref 30.0–36.0)
MCHC: 35.6 g/dL (ref 30.0–36.0)
MCV: 89.9 fL (ref 78.0–100.0)
MCV: 91 fL (ref 78.0–100.0)
MCV: 91.9 fL (ref 78.0–100.0)
Platelets: 206 10*3/uL (ref 150–400)
Platelets: 208 10*3/uL (ref 150–400)
Platelets: 235 10*3/uL (ref 150–400)
Platelets: 332 10*3/uL (ref 150–400)
RBC: 4.58 MIL/uL (ref 4.22–5.81)
RBC: 5.08 MIL/uL (ref 4.22–5.81)
RBC: 5.3 MIL/uL (ref 4.22–5.81)
RDW: 12.5 % (ref 11.5–15.5)
RDW: 12.8 % (ref 11.5–15.5)
RDW: 13 % (ref 11.5–15.5)
WBC: 10 10*3/uL (ref 4.0–10.5)
WBC: 12.1 10*3/uL — ABNORMAL HIGH (ref 4.0–10.5)
WBC: 5.9 10*3/uL (ref 4.0–10.5)
WBC: 7.9 10*3/uL (ref 4.0–10.5)
WBC: 9.6 10*3/uL (ref 4.0–10.5)

## 2011-01-04 LAB — COMPREHENSIVE METABOLIC PANEL
ALT: 14 U/L (ref 0–53)
AST: 23 U/L (ref 0–37)
AST: 24 U/L (ref 0–37)
Albumin: 2.2 g/dL — ABNORMAL LOW (ref 3.5–5.2)
Albumin: 2.2 g/dL — ABNORMAL LOW (ref 3.5–5.2)
Alkaline Phosphatase: 70 U/L (ref 39–117)
Alkaline Phosphatase: 71 U/L (ref 39–117)
BUN: 12 mg/dL (ref 6–23)
CO2: 28 mEq/L (ref 19–32)
Chloride: 103 mEq/L (ref 96–112)
Chloride: 97 mEq/L (ref 96–112)
Creatinine, Ser: 0.81 mg/dL (ref 0.4–1.5)
GFR calc Af Amer: >60 mL/min (ref >60)
GFR calc Af Amer: >60 mL/min (ref >60)
GFR calc non Af Amer: >60 mL/min (ref >60)
Glucose, Bld: 112 mg/dL — ABNORMAL HIGH (ref 70–99)
Glucose, Bld: 115 mg/dL — ABNORMAL HIGH (ref 70–99)
Potassium: 3.1 mEq/L — ABNORMAL LOW (ref 3.5–5.1)
Potassium: 4.1 mEq/L (ref 3.5–5.1)
Sodium: 131 mEq/L — ABNORMAL LOW (ref 135–145)
Sodium: 134 mEq/L — ABNORMAL LOW (ref 135–145)
Total Bilirubin: 0.6 mg/dL (ref 0.3–1.2)
Total Bilirubin: 0.9 mg/dL (ref 0.3–1.2)
Total Protein: 5.7 g/dL — ABNORMAL LOW (ref 6.0–8.3)

## 2011-01-04 LAB — URINALYSIS, ROUTINE W REFLEX MICROSCOPIC
Nitrite: NEGATIVE
Specific Gravity, Urine: 1.029 (ref 1.005–1.030)
pH: 5.5 (ref 5.0–8.0)

## 2011-01-04 LAB — DIFFERENTIAL
Basophils Relative: 0 % (ref 0–1)
Eosinophils Absolute: 0.1 10*3/uL (ref 0.0–0.7)
Eosinophils Relative: 1 % (ref 0–5)
Monocytes Relative: 7 % (ref 3–12)
Neutrophils Relative %: 76 % (ref 43–77)

## 2011-01-04 LAB — ABO/RH: ABO/RH(D): O POS

## 2011-01-04 LAB — TYPE AND SCREEN: ABO/RH(D): O POS

## 2011-01-04 LAB — CREATININE, SERUM
Creatinine, Ser: 0.66 mg/dL (ref 0.4–1.5)
Creatinine, Ser: 0.69 mg/dL (ref 0.4–1.5)
GFR calc Af Amer: >60 mL/min (ref >60)
GFR calc non Af Amer: >60 mL/min (ref >60)

## 2011-01-04 LAB — POTASSIUM: Potassium: 3.8 mEq/L (ref 3.5–5.1)

## 2011-01-04 LAB — CEA: CEA: 2.4 ng/mL (ref 0.0–5.0)

## 2011-01-04 LAB — LIPASE, BLOOD: Lipase: 14 U/L (ref 11–59)

## 2011-02-09 NOTE — Discharge Summary (Signed)
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   HOSPITAL COURSE:  [name] was admitted on February 28, 2008, by Dr.  Dwain Sarna due to a bowel obstruction.  He did have a high-grade  obstruction.  CT scan showed proximal obstruction, dilated small-bowel  loops.  He went to the operating room on February 28, 2009, for an open right  colectomy.  Please see the dictation.  Postoperatively, he has done  remarkably well.  He was able to be weaned from his PCA to oral  narcotics.  He had been taking oxycodone for pain but requests  nonnarcotic for discharge.  I have given him tramadol #60 started at 25  daily, increase after 3 days to twice daily, after 3 days to 3 daily up  to 25mg  q.i.d.  He has had a bowel movement today, has had no nausea or  vomiting and is tolerating a regular diet.  He is somewhat fatigued, but  his incision is without erythema.  Staples are intact.  Mild distention  but overall is doing fairly well.  Hemoglobin and hematocrit are 12.3  and 34 respectively.  Due to the positivity of his lymph nodes, he will  be referred to medical oncology.      Lennie Muckle, MD  Electronically Signed     ALA/MEDQ  D:  03/05/2009  T:  03/05/2009  Job:  [id number]   cc:   Brett Canales A. Cleta Alberts, M.D.  Fax: [phone]   Juanetta Gosling, MD  [address]  [place] [address]
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
## 2011-02-09 NOTE — Consult Note (Signed)
NAME:  Tim Obrien, Tim Obrien NO.:  [id number]   MEDICAL RECORD NO.:  [id number]          PATIENT TYPE:  EMS   LOCATION:  ED                           FACILITY:  [facility]   PHYSICIAN:  Anselm Pancoast. Weatherly, M.D.DATE OF BIRTH:  29-Sep-1945   DATE OF CONSULTATION:  03/07/2009  DATE OF DISCHARGE:  03/07/2009                                 CONSULTATION   CHIEF COMPLAINT:  1. A little drainage from lower midline incision of recent right      colectomy.  2. Bloated, nausea and vomiting.   HISTORY:  Tim Obrien is a 65 year old Caucasian male who was  admitted to [facility] through the ER last Thursday with abdominal  distention, nausea and vomiting and x-ray CT consistent with a  mechanical small-bowel obstruction.  He was seen by Dr. Dwain Sarna who  was doing a surgery.  The patient refused a nasogastric tube and then  after reexamining, he agreed to proceed for surgery and on Friday was  taken to the operating room by Dr. Dwain Sarna with Dr. 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  Postoperatively, the patient had a  nasogastric tube and approximately 4 days after surgery had one bowel  movement.  Dr. Freida Busman who was covering for Dr. Dwain Sarna released the  patient on Wednesday afternoon after the one bowel movement.  He wanted  no narcotics and was given tramadol with instructions that on the first  day to take 25 mg, the second day to 225 and the third day Tuesday 325,  and he was taking this tramadol as if it was prescribed with no  instructions on how to taper it down.  He says he was not having much  pain, but he had no bowel movements yesterday and then today he has had  three and a fourth bowel movement while here in the emergency room.  He  started having a small amount of kind of serous drainage from the lower  aspect of his wound and his daughter called because of that, and I  talked with him  and they wanted to know whether could call any medicine  for nausea and vomiting.  I recommended that I examine the patient  before calling in pain medications as he should be feeling better and  not needing medicine for nausea and vomiting.  He arrived in the  emergency room approximately an hour later and on examination he is not  acutely ill.  He is still distended and he has got a fairly generous  midline incision.  There was no obvious wound infection on serum  collection.  On discussing with the patient, I was asking about the pain  medication of why he was tripling it or increasing it as instructed, and  he said that was what was written on the prescription, but I got the  discharge summary by Dr. Freida Busman and that was the max amount of medicine  that he could take and she had not made clear about him decrease  medicine or not taking it if he  was not having pain.  The patient's  abdomen is fairly quiet and bloated, but the patient says he is less  bloated now that he was before surgery and on the CT compared with the  flat and upright abdominal films that I did this evening, he He is less  distended compared with an x-ray a week ago, but he is certainly a lot  more gaseous than I would expect is normal.  While in the emergency  room, he had two more bowel movements with passing a large amount of  flatus and the patient says he is definitely feeling better.  White  count was 10,000, hematocrit 35.  The patient's electrolytes are normal  with the exception of a potassium of 3.1 and his BUN and creatinine are  normal.  The patient wants to be released and says he does not need the  pain medication, it was just a just miscommunication that he was to take  it that way, thinking it was prescribed on a certain timeframe.  He said  as far as the pain, he does not any pain medication on maybe a Tylenol  if anything and desires greatly to be discharged.  After reviewing the  patient's x-rays and  in talking with both he, his wife and daughter, I  think that he can be released, but I would keep him on liquid diet.  We  will talk with him in the morning and if he is becoming less distended,  having more bowel movements since now he has had five bowel movements  today, the first one was formed and the others have been predominantly  gas with a little bit of loose bowel movements.  I think that if he is  better in the morning, it would be alright to let him go to a full  liquid diet and just stop the tramadol.  I do not think with a normal  white count and afebrile that there is an obviously an underlying wound  infection or peritonitis.  His pulse was slightly elevated, but his  blood pressure was fine and his temperature was 98.3.  He does not want  to stay  which I offered him, but we will talk with him in the morning and he is  aware that if he is feeling worse, needs nausea and vomiting medications  that he needs to be admitted for IVs.  The patient understands and I  will call him in the morning to see what progress he has made over the  next 8-10 hours.      Anselm Pancoast. Zachery Dakins, M.D.  Electronically Signed     WJW/MEDQ  D:  03/07/2009  T:  03/08/2009  Job:  [id number]   cc:   Juanetta Gosling, MD  [address]  [place] [address]

## 2011-02-09 NOTE — Op Note (Signed)
NAME:  Tim Obrien, MILICH NO.:  [id number]   MEDICAL RECORD NO.:  [id number]          PATIENT TYPE:  INP   LOCATION:  1308                         FACILITY:  [facility]   PHYSICIAN:  Juanetta Gosling, MDDATE OF BIRTH:  [birth year]   DATE OF PROCEDURE:  02/28/2009  DATE OF DISCHARGE:                               OPERATIVE REPORT   PREOPERATIVE DIAGNOSIS:  Right colon cancer.   POSTOPERATIVE DIAGNOSIS:  Right colon cancer.   PROCEDURE:  Open right colectomy.   SURGEON:  Dr. Harden Mo.   ASSISTANT:  Dr. Bertram Savin.   ANESTHESIA:  General.   FINDINGS:  Right colon mass with very dilated small bowel and proximal  right colon.   SPECIMENS:  Right colon to pathology.   ESTIMATED BLOOD LOSS:  220 mL.   COMPLICATIONS:  None.   DRAINS:  None.   DISPOSITION:  To recovery room in stable condition.   INDICATIONS:  Tim Obrien is a 65 year old male who is otherwise healthy,  who presented with symptoms of a bowel obstruction on a CT scan.  It  appeared he had a proximal colon mass.  I attempted to decompress him  overnight, but he refused the NG tube and I returned to see him after I  was out of the operating room.  He had increased tenderness and we  discussed going to the operating room immediately to explore him.  We  discussed an open right colectomy and I discussed the risks and  indications of this procedure with him and his daughter.   PROCEDURE:  After informed consent was obtained, the patient was taken  to the operative.  He was administered 1 gram of intravenous Invanz.  Sequential compression devices were placed on the lower extremities  prior to beginning the procedure.  A nasogastric tube was placed prior  to beginning the procedure and then he underwent rapid sequence  induction and general endotracheal anesthesia without complication.  His  abdomen was then prepped and draped in the standard sterile surgical  fashion.  A Foley catheter was  placed.  A surgical timeout was then  performed.   A midline incision was then made and the peritoneum was entered.  He was  noted to have diffusely dilated small bowel, as well as his cecum.  This  was very difficult to roll his cecum or right colon medially.  A  Bookwalter retractor was inserted.  The white line of Toldt was taken  down with electrocautery.  The ureter and duodenum were identified and  preserved throughout the operation.  There was a mass in the proximal  right colon at the beginning of the cecum that was constricting and  clearly where the obstruction was.  This was decompressed.  The colon  was then medialized.  I placed a 3-0 silk pursestring suture in the  small bowel and used a pool sucker to evacuate well over a liter of  feculent material.  This was then tied down.  The small bowel was  divided with the 75 GIA.  The spot on the transverse colon was then  identified and  divided with a 75 GIA.  The LigaSure was then used to  divide the mesentery.  There were some large lymph nodes present in this  section.  The named vessels were tied with a 2-0 silk ligature as well  as a 2-0 silk tie.  The colon was then removed as a specimen.  The colon  and small bowel were then brought together.  A 3-0 silk was placed at  the apex, as well as a crotch stitch.  Enterotomies were made.  The GIA  75 was used to create a nice anastomosis that appeared hemostatic.  A TA-  65 stapler was used to close the common enterotomy.  A 3-0 Vicryl was  then used to close the mesenteric defect.  Irrigation was performed with  about 5 liters of irrigation until this was clear.  The liver was  without any lesions and the remainder of the small bowel was without any  lesions.  The NG tube was in good position upon completion.  The fascia  was then closed with a #1 looped PDS.  The skin was then closed loosely  with staples with Telfa wicks in between those.  He tolerated this well  and was  extubated and transferred to the recovery room in stable  condition.      Juanetta Gosling, MD  Electronically Signed     MCW/MEDQ  D:  02/28/2009  T:  02/28/2009  Job:  [id number]

## 2011-02-25 ENCOUNTER — Other Ambulatory Visit: Payer: Self-pay | Admitting: Oncology

## 2011-02-25 ENCOUNTER — Encounter (HOSPITAL_BASED_OUTPATIENT_CLINIC_OR_DEPARTMENT_OTHER): Payer: Federal, State, Local not specified - PPO | Admitting: Oncology

## 2011-02-25 ENCOUNTER — Other Ambulatory Visit (HOSPITAL_COMMUNITY): Payer: Self-pay

## 2011-02-25 ENCOUNTER — Ambulatory Visit (HOSPITAL_COMMUNITY)
Admission: RE | Admit: 2011-02-25 | Discharge: 2011-02-25 | Disposition: A | Discharge status: Home / Self Care | Payer: Federal, State, Local not specified - PPO | Source: Ambulatory Visit | Attending: Oncology | Admitting: Oncology

## 2011-02-25 ENCOUNTER — Encounter (HOSPITAL_COMMUNITY): Payer: Self-pay

## 2011-02-25 DIAGNOSIS — J984 Other disorders of lung: Secondary | ICD-10-CM | POA: Insufficient documentation

## 2011-02-25 DIAGNOSIS — C189 Malignant neoplasm of colon, unspecified: Secondary | ICD-10-CM

## 2011-02-25 LAB — BASIC METABOLIC PANEL - CANCER CENTER ONLY
BUN, Bld: 11 mg/dL (ref 7–22)
Chloride: 98 mEq/L (ref 98–108)
Creat: 0.9 mg/dl (ref 0.6–1.2)
Glucose, Bld: 89 mg/dL (ref 73–118)

## 2011-02-25 LAB — CEA: CEA: 2.6 ng/mL (ref 0.0–5.0)

## 2011-02-25 MED ORDER — IOHEXOL 300 MG/ML  SOLN
100.0000 mL | Freq: Once | INTRAMUSCULAR | Status: AC | PRN
  Administered 2011-02-25: 100 mL via INTRAVENOUS

## 2011-03-04 ENCOUNTER — Encounter (HOSPITAL_BASED_OUTPATIENT_CLINIC_OR_DEPARTMENT_OTHER): Payer: Federal, State, Local not specified - PPO | Admitting: Oncology

## 2011-03-04 DIAGNOSIS — D6959 Other secondary thrombocytopenia: Secondary | ICD-10-CM

## 2011-03-04 DIAGNOSIS — C182 Malignant neoplasm of ascending colon: Secondary | ICD-10-CM

## 2011-03-04 DIAGNOSIS — F17200 Nicotine dependence, unspecified, uncomplicated: Secondary | ICD-10-CM

## 2011-03-04 DIAGNOSIS — G609 Hereditary and idiopathic neuropathy, unspecified: Secondary | ICD-10-CM

## 2011-08-24 ENCOUNTER — Other Ambulatory Visit (HOSPITAL_BASED_OUTPATIENT_CLINIC_OR_DEPARTMENT_OTHER): Payer: Federal, State, Local not specified - PPO | Admitting: Lab

## 2011-08-24 ENCOUNTER — Other Ambulatory Visit: Payer: Self-pay | Admitting: Oncology

## 2011-08-24 DIAGNOSIS — Z5111 Encounter for antineoplastic chemotherapy: Secondary | ICD-10-CM

## 2011-08-24 DIAGNOSIS — D696 Thrombocytopenia, unspecified: Secondary | ICD-10-CM

## 2011-08-24 DIAGNOSIS — C182 Malignant neoplasm of ascending colon: Secondary | ICD-10-CM

## 2011-08-24 DIAGNOSIS — Z452 Encounter for adjustment and management of vascular access device: Secondary | ICD-10-CM

## 2011-08-24 LAB — CBC WITH DIFFERENTIAL/PLATELET
Basophils Absolute: 0 10*3/uL (ref 0.0–0.1)
Eosinophils Absolute: 0.1 10*3/uL (ref 0.0–0.5)
HCT: 43.2 % (ref 38.4–49.9)
HGB: 14.8 g/dL (ref 13.0–17.1)
LYMPH%: 19.1 % (ref 14.0–49.0)
MCV: 93.3 fL (ref 79.3–98.0)
MONO#: 0.5 10*3/uL (ref 0.1–0.9)
MONO%: 6.9 % (ref 0.0–14.0)
NEUT#: 5 10*3/uL (ref 1.5–6.5)
NEUT%: 71.8 % (ref 39.0–75.0)
Platelets: 151 10*3/uL (ref 140–400)
WBC: 6.9 10*3/uL (ref 4.0–10.3)

## 2011-08-31 ENCOUNTER — Ambulatory Visit (HOSPITAL_BASED_OUTPATIENT_CLINIC_OR_DEPARTMENT_OTHER): Payer: Federal, State, Local not specified - PPO | Admitting: Oncology

## 2011-08-31 ENCOUNTER — Encounter: Payer: Self-pay | Admitting: *Deleted

## 2011-08-31 VITALS — BP 108/66 | HR 60 | Temp 97.5°F | Wt 154.4 lb

## 2011-08-31 DIAGNOSIS — G620 Drug-induced polyneuropathy: Secondary | ICD-10-CM

## 2011-08-31 DIAGNOSIS — F17200 Nicotine dependence, unspecified, uncomplicated: Secondary | ICD-10-CM

## 2011-08-31 DIAGNOSIS — C182 Malignant neoplasm of ascending colon: Secondary | ICD-10-CM

## 2011-08-31 DIAGNOSIS — C189 Malignant neoplasm of colon, unspecified: Secondary | ICD-10-CM

## 2011-08-31 NOTE — Progress Notes (Signed)
OFFICE PROGRESS NOTE   INTERVAL HISTORY:   [name] returns as scheduled. He feels well. He has no complaint. He continues to smoke.  Objective:  Vital signs in last 24 hours:  Blood pressure 108/66, pulse 60, temperature 97.5 F (36.4 C), temperature source Oral, weight 154 lb 6.4 oz (70.035 kg).    HEENT: Neck without mass Lymphatics: No cervical, supraclavicular, axillary, or inguinal nodes Resp: Lungs with distant breath sounds. No respiratory distress. Cardio: Regular rate and rhythm GI: Abdomen is soft and nontender. No hepatomegaly. No mass. Vascular: No leg edema     Lab Results:  CBC  Lab Results  Component Value Date   WBC 6.9 08/24/2011   HGB 14.8 08/24/2011   HCT 43.2 08/24/2011   MCV 93.3 08/24/2011   PLT 151 08/24/2011    Chemistry:      Component Value Date/Time   NA 141 02/25/2011 0756   NA 138 02/24/2010 1004   K 4.1 02/25/2011 0756   K 4.0 02/24/2010 1004   CL 98 02/25/2011 0756   CL 103 02/24/2010 1004   CO2 30 02/25/2011 0756   CO2 28 02/24/2010 1004   GLUCOSE 89 02/25/2011 0756   GLUCOSE 110* 02/24/2010 1004   BUN 11 02/25/2011 0756   BUN 14 02/24/2010 1004   CREATININE 0.9 02/25/2011 0756   CREATININE 0.86 02/24/2010 1004   CALCIUM 9.0 02/25/2011 0756   CALCIUM 9.1 02/24/2010 1004   PROT 7.4 02/24/2010 1004   ALBUMIN 4.0 02/24/2010 1004   AST 22 02/24/2010 1004   ALT 10 02/24/2010 1004   ALKPHOS 59 02/24/2010 1004   BILITOT 1.0 02/24/2010 1004   GFRNONAA >60 04/02/2009 1125   GFRAA  Value: >60        The eGFR has been calculated using the MDRD equation. This calculation has not been validated in all clinical situations. eGFR's persistently <60 mL/min signify possible Chronic Kidney Disease. 04/02/2009 1125   CEA on 08/24/2011-2.3  Medications: I have reviewed the patient's current medications.  Assessment/Plan: 1. Stage IIIC colon cancer (T4 N2) status post right colectomy February 28, 2009.  He completed 12 cycles of adjuvant FOLFOX chemotherapy.   Oxaliplatin was deleted from cycles #6, 7, 10, 11, and 12.   Restaging CT evaluation Feb 24, 2010, revealed no evidence of recurrent colon cancer.  Restaging CT evaluation Feb 25, 2011, showed no evidence for recurrent colon cancer. 2. 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  The platelet count was mildly decreased prior to beginning chemotherapy. The platelet count was in the low normal range on 08/24/2011. 3. Ongoing tobacco use - he has decreased the number of cigarettes he is smoking on a daily basis. I encouraged him to discontinue smoking. 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   Disposition:  He appears stable. There is no clinical evidence of recurrent colon cancer. He will be scheduled for an office visit and restaging CT evaluation in 6 months. He continues colonoscopy followup with Dr. Christella Hartigan.   Lucile Shutters, MD  08/31/2011  2:08 PM

## 2012-02-24 ENCOUNTER — Ambulatory Visit (HOSPITAL_COMMUNITY)
Admission: RE | Admit: 2012-02-24 | Discharge: 2012-02-24 | Disposition: A | Discharge status: Home / Self Care | Payer: Medicare Other | Source: Ambulatory Visit | Attending: Oncology | Admitting: Oncology

## 2012-02-24 ENCOUNTER — Other Ambulatory Visit (HOSPITAL_BASED_OUTPATIENT_CLINIC_OR_DEPARTMENT_OTHER): Payer: Medicare Other | Admitting: Lab

## 2012-02-24 ENCOUNTER — Other Ambulatory Visit: Payer: Self-pay | Admitting: Oncology

## 2012-02-24 ENCOUNTER — Other Ambulatory Visit (HOSPITAL_COMMUNITY): Payer: Federal, State, Local not specified - PPO

## 2012-02-24 DIAGNOSIS — C189 Malignant neoplasm of colon, unspecified: Secondary | ICD-10-CM

## 2012-02-24 DIAGNOSIS — J984 Other disorders of lung: Secondary | ICD-10-CM | POA: Diagnosis not present

## 2012-02-24 DIAGNOSIS — Z9049 Acquired absence of other specified parts of digestive tract: Secondary | ICD-10-CM | POA: Diagnosis not present

## 2012-02-24 DIAGNOSIS — Z9221 Personal history of antineoplastic chemotherapy: Secondary | ICD-10-CM | POA: Diagnosis not present

## 2012-02-24 DIAGNOSIS — I700 Atherosclerosis of aorta: Secondary | ICD-10-CM | POA: Insufficient documentation

## 2012-02-24 DIAGNOSIS — M5137 Other intervertebral disc degeneration, lumbosacral region: Secondary | ICD-10-CM | POA: Diagnosis not present

## 2012-02-24 DIAGNOSIS — N281 Cyst of kidney, acquired: Secondary | ICD-10-CM | POA: Diagnosis not present

## 2012-02-24 DIAGNOSIS — J438 Other emphysema: Secondary | ICD-10-CM | POA: Diagnosis not present

## 2012-02-24 DIAGNOSIS — M51379 Other intervertebral disc degeneration, lumbosacral region without mention of lumbar back pain or lower extremity pain: Secondary | ICD-10-CM | POA: Insufficient documentation

## 2012-02-24 LAB — BASIC METABOLIC PANEL - CANCER CENTER ONLY
Chloride: 102 mEq/L (ref 98–108)
Glucose, Bld: 99 mg/dL (ref 73–118)
Potassium: 4.3 mEq/L (ref 3.3–4.7)
Sodium: 143 mEq/L (ref 128–145)

## 2012-02-24 MED ORDER — IOHEXOL 300 MG/ML  SOLN
100.0000 mL | Freq: Once | INTRAMUSCULAR | Status: AC | PRN
  Administered 2012-02-24: 100 mL via INTRAVENOUS

## 2012-02-29 ENCOUNTER — Telehealth: Payer: Self-pay | Admitting: Oncology

## 2012-02-29 ENCOUNTER — Ambulatory Visit (HOSPITAL_BASED_OUTPATIENT_CLINIC_OR_DEPARTMENT_OTHER): Payer: Medicare Other | Admitting: Oncology

## 2012-02-29 VITALS — BP 128/72 | HR 63 | Temp 97.1°F | Ht 68.5 in | Wt 157.2 lb

## 2012-02-29 DIAGNOSIS — C189 Malignant neoplasm of colon, unspecified: Secondary | ICD-10-CM

## 2012-02-29 DIAGNOSIS — C182 Malignant neoplasm of ascending colon: Secondary | ICD-10-CM

## 2012-02-29 DIAGNOSIS — F17200 Nicotine dependence, unspecified, uncomplicated: Secondary | ICD-10-CM

## 2012-02-29 NOTE — Telephone Encounter (Signed)
appts made and printed for pt aom °

## 2012-02-29 NOTE — Progress Notes (Signed)
   [facility]    OFFICE PROGRESS NOTE   INTERVAL HISTORY:   He returns as scheduled. He feels well. No complaint. He continues to smoke.  Objective:  Vital signs in last 24 hours:  Blood pressure 128/72, pulse 63, temperature 97.1 F (36.2 C), temperature source Oral, height 5' 8.5" (1.74 m), weight 157 lb 3.2 oz (71.305 kg).    HEENT: Neck without mass Lymphatics: No cervical, supraclavicular, axillary, or inguinal nodes Resp: Distant breath sounds Cardio: Regular rate and rhythm GI: No hepatosplenomegaly, no mass Vascular: No leg edema  Lab Results:  WBC 6.9 08/24/2011   HGB 14.8 08/24/2011   HCT 43.2 08/24/2011   MCV 93.3 08/24/2011   PLT 151 08/24/2011    CEA 2.2 on 02/24/2012  X-rays: CTs on 02/24/2012-no evidence of metastatic disease in the chest, abdomen, or pelvis   Medications: I have reviewed the patient's current medications.  Assessment/Plan: 1. Stage IIIC colon cancer (T4 N2) status post right colectomy February 28, 2009. He completed 12 cycles of adjuvant FOLFOX chemotherapy. Oxaliplatin was deleted from cycles #6, 7, 10, 11, and 12. Restaging CT evaluation Feb 24, 2012, showed no evidence for recurrent colon cancer. 2. 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 The platelet count was mildly decreased prior to beginning chemotherapy. The platelet count was in the low normal range on 08/24/2011. 3. Ongoing tobacco use -  I encouraged him to discontinue smoking. 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  Disposition:  He remains in clinical remission from the colon cancer. He will return for an office visit and CEA in 6 months.   Thornton Papas, MD  02/29/2012  8:57 PM

## 2012-08-25 ENCOUNTER — Other Ambulatory Visit (HOSPITAL_BASED_OUTPATIENT_CLINIC_OR_DEPARTMENT_OTHER): Payer: Medicare Other

## 2012-08-25 DIAGNOSIS — C189 Malignant neoplasm of colon, unspecified: Secondary | ICD-10-CM

## 2012-08-25 LAB — CEA: CEA: 2.9 ng/mL (ref 0.0–5.0)

## 2012-08-28 ENCOUNTER — Telehealth: Payer: Self-pay | Admitting: Oncology

## 2012-08-28 ENCOUNTER — Ambulatory Visit (HOSPITAL_BASED_OUTPATIENT_CLINIC_OR_DEPARTMENT_OTHER): Payer: Medicare Other | Admitting: Oncology

## 2012-08-28 VITALS — BP 135/66 | HR 63 | Temp 98.2°F | Resp 18 | Ht 68.5 in | Wt 159.2 lb

## 2012-08-28 DIAGNOSIS — C182 Malignant neoplasm of ascending colon: Secondary | ICD-10-CM | POA: Diagnosis not present

## 2012-08-28 DIAGNOSIS — F17200 Nicotine dependence, unspecified, uncomplicated: Secondary | ICD-10-CM

## 2012-08-28 DIAGNOSIS — C189 Malignant neoplasm of colon, unspecified: Secondary | ICD-10-CM

## 2012-08-28 NOTE — Telephone Encounter (Signed)
gv and printed appt schedule for pt for June 2014 ° °

## 2012-08-28 NOTE — Progress Notes (Signed)
   [facility]    OFFICE PROGRESS NOTE   INTERVAL HISTORY:   He returns as scheduled. He feels well. Good appetite and energy level. No difficulty with bowel function. He had a flareup of "allergies "after working in leaves. Minimal numbness in the feet when showering  Objective:  Vital signs in last 24 hours:  Blood pressure 135/66, pulse 63, temperature 98.2 F (36.8 C), temperature source Oral, resp. rate 18, height 5' 8.5" (1.74 m), weight 159 lb 3.2 oz (72.213 kg).    HEENT: Neck without mass Lymphatics: No cervical, supraclavicular, axillary, or inguinal nodes Resp: Lungs clear bilaterally Cardio: Regular rate and rhythm GI: No hepatomegaly, no mass Vascular: No leg edema   Lab Results:  CEA on 08/25/2012-2.9 ( 2.6 on 02/25/11)   Medications: I have reviewed the patient's current medications.  Assessment/Plan: 1. Stage IIIC colon cancer (T4 N2) status post right colectomy February 28, 2009. He completed 12 cycles of adjuvant FOLFOX chemotherapy. Oxaliplatin was deleted from cycles #6, 7, 10, 11, and 12. Restaging CT evaluation Feb 24, 2012, showed no evidence for recurrent colon cancer. 2. 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 The platelet count was mildly decreased prior to beginning chemotherapy. The platelet count was in the low normal range on 08/24/2011. 3. Ongoing tobacco use - I encouraged him to discontinue smoking. 4. Oxaliplatin neuropathy -essentially resolved   Disposition:  He remains in clinical remission from colon cancer. Mr. Steer will return for an office visit and CEA in 6 months. He last underwent a colonoscopy by Dr. Christella Hartigan in August of 2011. He will be due for colonoscopy in August of 2014.   Thornton Papas, MD  08/28/2012  6:38 PM

## 2013-02-26 ENCOUNTER — Other Ambulatory Visit (HOSPITAL_BASED_OUTPATIENT_CLINIC_OR_DEPARTMENT_OTHER): Payer: Medicare Other | Admitting: Lab

## 2013-02-26 ENCOUNTER — Telehealth: Payer: Self-pay | Admitting: Oncology

## 2013-02-26 ENCOUNTER — Ambulatory Visit (HOSPITAL_BASED_OUTPATIENT_CLINIC_OR_DEPARTMENT_OTHER): Payer: Medicare Other | Admitting: Nurse Practitioner

## 2013-02-26 VITALS — BP 122/77 | HR 57 | Temp 97.4°F | Resp 18 | Ht 68.0 in | Wt 156.5 lb

## 2013-02-26 DIAGNOSIS — F17200 Nicotine dependence, unspecified, uncomplicated: Secondary | ICD-10-CM | POA: Diagnosis not present

## 2013-02-26 DIAGNOSIS — C189 Malignant neoplasm of colon, unspecified: Secondary | ICD-10-CM | POA: Diagnosis not present

## 2013-02-26 LAB — CEA: CEA: 2.7 ng/mL (ref 0.0–5.0)

## 2013-02-26 NOTE — Telephone Encounter (Signed)
gv and printed appt sched and avs for pt  °

## 2013-02-26 NOTE — Progress Notes (Signed)
OFFICE PROGRESS NOTE  Interval history:  Mr. Reason returns as scheduled. He feels well. He has a good appetite and good energy level. He remains very active. He denies pain. Specifically no abdominal pain. No change in bowel habits. No hematochezia or melena. Neuropathy symptoms are "99% gone". He notes mild numbness in the toes when showering. He continues to smoke. He estimates one pack per day.   Objective: Blood pressure 122/77, pulse 57, temperature 97.4 F (36.3 C), temperature source Oral, resp. rate 18, height 5\' 8"  (1.727 m), weight 156 lb 8 oz (70.988 kg).  No thrush or ulceration. No palpable cervical, supraclavicular, axillary or inguinal lymph nodes. Lungs clear. Regular cardiac rhythm. Abdomen soft. No hepatomegaly. No mass. Extremities without edema.  Lab Results: Lab Results  Component Value Date   WBC 6.9 08/24/2011   HGB 14.8 08/24/2011   HCT 43.2 08/24/2011   MCV 93.3 08/24/2011   PLT 151 08/24/2011    Chemistry:    Chemistry      Component Value Date/Time   NA 143 02/24/2012 0814   NA 138 02/24/2010 1004   K 4.3 02/24/2012 0814   K 4.0 02/24/2010 1004   CL 102 02/24/2012 0814   CL 103 02/24/2010 1004   CO2 30 02/24/2012 0814   CO2 28 02/24/2010 1004   BUN 11 02/24/2012 0814   BUN 14 02/24/2010 1004   CREATININE 1.0 02/24/2012 0814   CREATININE 0.86 02/24/2010 1004      Component Value Date/Time   CALCIUM 8.9 02/24/2012 0814   CALCIUM 9.1 02/24/2010 1004   ALKPHOS 59 02/24/2010 1004   AST 22 02/24/2010 1004   ALT 10 02/24/2010 1004   BILITOT 1.0 02/24/2010 1004       Studies/Results: No results found.  Medications: I have reviewed the patient's current medications.  Assessment/Plan:  1. Stage IIIC colon cancer (T4 N2) status post right colectomy February 28, 2009. He completed 12 cycles of adjuvant FOLFOX chemotherapy. Oxaliplatin was deleted from cycles #6, 7, 10, 11, and 12. Restaging CT evaluation Feb 24, 2012, showed no evidence for recurrent colon  cancer. 2. 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 The platelet count was mildly decreased prior to beginning chemotherapy. The platelet count was in the low normal range on 08/24/2011. 3. Ongoing tobacco use. We discussed smoking cessation. 4. Oxaliplatin neuropathy. Essentially resolved.  Disposition-Mr. Goris appears stable. He remains in clinical remission from colon cancer. We will followup on the CEA from today. He will return for an office visit and CEA in 6 months. He is due for a colonoscopy August of this year.  Plan reviewed with Dr. Truett Perna.  Lonna Cobb ANP/GNP-BC

## 2013-02-27 ENCOUNTER — Telehealth: Payer: Self-pay | Admitting: *Deleted

## 2013-02-27 NOTE — Telephone Encounter (Signed)
Notified pt of CEA level wnl.  He verbalized understanding.

## 2013-02-27 NOTE — Telephone Encounter (Signed)
Message copied by Wende Mott on Tue Feb 27, 2013  3:56 PM ------      Message from: Wandalee Ferdinand      Created: Tue Feb 27, 2013  3:55 PM                   ----- Message -----         From: Ladene Artist, MD         Sent: 02/26/2013  10:16 PM           To: Wandalee Ferdinand, RN, Glori Luis, RN, #            Please call patient, cea is normal ------

## 2013-04-12 ENCOUNTER — Ambulatory Visit (INDEPENDENT_AMBULATORY_CARE_PROVIDER_SITE_OTHER): Payer: Medicare Other | Admitting: Family Medicine

## 2013-04-12 VITALS — BP 128/71 | HR 76 | Temp 97.9°F | Resp 18 | Ht 68.0 in | Wt 152.0 lb

## 2013-04-12 DIAGNOSIS — J309 Allergic rhinitis, unspecified: Secondary | ICD-10-CM | POA: Diagnosis not present

## 2013-04-12 DIAGNOSIS — J019 Acute sinusitis, unspecified: Secondary | ICD-10-CM | POA: Diagnosis not present

## 2013-04-12 MED ORDER — AMOXICILLIN-POT CLAVULANATE 875-125 MG PO TABS: 1.0000 | ORAL_TABLET | Freq: Two times a day (BID) | ORAL | Status: DC

## 2013-04-12 MED ORDER — FLUTICASONE PROPIONATE 50 MCG/ACT NA SUSP: 2.0000 | Freq: Every day | NASAL | Status: DC

## 2013-04-12 NOTE — Patient Instructions (Signed)
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
## 2013-04-13 ENCOUNTER — Encounter: Payer: Self-pay | Admitting: Family Medicine

## 2013-05-02 ENCOUNTER — Encounter: Payer: Self-pay | Admitting: Gastroenterology

## 2013-05-04 ENCOUNTER — Encounter: Payer: Self-pay | Admitting: Gastroenterology

## 2013-06-26 ENCOUNTER — Ambulatory Visit (AMBULATORY_SURGERY_CENTER): Payer: Self-pay | Admitting: *Deleted

## 2013-06-26 VITALS — Ht 69.5 in | Wt 156.2 lb

## 2013-06-26 DIAGNOSIS — Z85038 Personal history of other malignant neoplasm of large intestine: Secondary | ICD-10-CM

## 2013-06-26 MED ORDER — PEG-KCL-NACL-NASULF-NA ASC-C 100 G PO SOLR: ORAL | Status: DC

## 2013-07-10 ENCOUNTER — Ambulatory Visit (AMBULATORY_SURGERY_CENTER): Payer: Medicare Other | Admitting: Gastroenterology

## 2013-07-10 ENCOUNTER — Encounter: Payer: Self-pay | Admitting: Gastroenterology

## 2013-07-10 VITALS — BP 98/58 | HR 59 | Temp 96.5°F | Resp 20 | Ht 69.0 in | Wt 156.0 lb

## 2013-07-10 DIAGNOSIS — Z85038 Personal history of other malignant neoplasm of large intestine: Secondary | ICD-10-CM | POA: Diagnosis not present

## 2013-07-10 DIAGNOSIS — D126 Benign neoplasm of colon, unspecified: Secondary | ICD-10-CM

## 2013-07-10 MED ORDER — SODIUM CHLORIDE 0.9 % IV SOLN: 500.0000 mL | INTRAVENOUS | Status: DC

## 2013-07-10 NOTE — Op Note (Signed)
[facility] [address] [place], [address]   COLONOSCOPY PROCEDURE REPORT  PATIENT: Tim Obrien, Tim Obrien  MR#: [id number] BIRTHDATE: 1946-04-23 , 67  yrs. old GENDER: Male ENDOSCOPIST: Rachael Fee, MD PROCEDURE DATE:  07/10/2013 PROCEDURE:   Colonoscopy with snare polypectomy First Screening Colonoscopy - Avg.  risk and is 50 yrs.  old or older - No.  Prior Negative Screening - Now for repeat screening. N/A  History of Adenoma - Now for follow-up colonoscopy & has been > or = to 3 yrs.  N/A  Polyps Removed Today? Yes. ASA CLASS:   Class II INDICATIONS:T4 node +, right sided colon cancer resected June 2010 by Dr.  Dwain Sarna; chemo completed December 2010.  Colonoscopy Christella Hartigan 02/2010 found numerous left sided hyperplastic polyps, sampled. MEDICATIONS: Fentanyl 75 mcg IV, Versed 6 mg IV, and These medications were titrated to patient response per physician's verbal order  DESCRIPTION OF PROCEDURE:   After the risks benefits and alternatives of the procedure were thoroughly explained, informed consent was obtained.  A digital rectal exam revealed no abnormalities of the rectum.   The LB [id number]  endoscope was introduced through the anus and advanced to the surgical anastomosis. No adverse events experienced.   The quality of the prep was good.  The instrument was then slowly withdrawn as the colon was fully examined.  COLON FINDINGS: The right sided ileocolonic anastomosis was normal appearing.  One polyp was found, removed and sent to pathology. This was 2-26mm across, sessile, located in transverse segment, removed with cold snare.  There were again several hyperplastic appearing rectosigmoid polyps (previously sampled in 2011).  The examination was otherwise normal.  Retroflexed views revealed no abnormalities. The time to cecum=NA.  Withdrawal time=6 minutes 12 seconds.  The scope was withdrawn and the procedure completed. COMPLICATIONS: There  were no complications.  ENDOSCOPIC IMPRESSION: The right sided ileocolonic anastomosis was normal appearing.  One polyp was found, removed and sent to pathology.  This was 2-84mm across, sessile, located in transverse segment, removed with cold snare.  There were again several hyperplastic appearing rectosigmoid polyps (previously sampled in 2011).  The examination was otherwise normal.  RECOMMENDATIONS: Given your personal history of colon cancer, you should have a repeat colonoscopy in 5 years even if the polyp removed today is NOT precancerous.   eSigned:  Rachael Fee, MD 07/10/2013 10:18 AM

## 2013-07-10 NOTE — Progress Notes (Signed)
Patient did not experience any of the following events: a burn prior to discharge; a fall within the facility; wrong site/side/patient/procedure/implant event; or a hospital transfer or hospital admission upon discharge from the facility. (G8907) Patient did not have preoperative order for IV antibiotic SSI prophylaxis. (G8918)  

## 2013-07-10 NOTE — Patient Instructions (Addendum)
One of your biggest health concerns is your smoking.  This increases your risk for most cancers and serious cardiovascular diseases such as strokes, heart attacks.  You should try your best to stop.  If you need assistance, please contact your PCP or Smoking Cessation Class at [facility] ([phone]) or [facility] (1-800-QUIT-NOW).   YOU HAD AN ENDOSCOPIC PROCEDURE TODAY AT THE [facility]: Refer to the procedure report that was given to you for any specific questions about what was found during the examination.  If the procedure report does not answer your questions, please call your gastroenterologist to clarify.  If you requested that your care partner not be given the details of your procedure findings, then the procedure report has been included in a sealed envelope for you to review at your convenience later.  YOU SHOULD EXPECT: Some feelings of bloating in the abdomen. Passage of more gas than usual.  Walking can help get rid of the air that was put into your GI tract during the procedure and reduce the bloating. If you had a lower endoscopy (such as a colonoscopy or flexible sigmoidoscopy) you may notice spotting of blood in your stool or on the toilet paper. If you underwent a bowel prep for your procedure, then you may not have a normal bowel movement for a few days.  DIET: Your first meal following the procedure should be a light meal and then it is ok to progress to your normal diet.  A half-sandwich or bowl of soup is an example of a good first meal.  Heavy or fried foods are harder to digest and may make you feel nauseous or bloated.  Likewise meals heavy in dairy and vegetables can cause extra gas to form and this can also increase the bloating.  Drink plenty of fluids but you should avoid alcoholic beverages for 24 hours.  ACTIVITY: Your care partner should take you home directly after the procedure.  You should plan to take it easy, moving slowly for the rest of  the day.  You can resume normal activity the day after the procedure however you should NOT DRIVE or use heavy machinery for 24 hours (because of the sedation medicines used during the test).    SYMPTOMS TO REPORT IMMEDIATELY: A gastroenterologist can be reached at any hour.  During normal business hours, 8:30 AM to 5:00 PM Monday through Friday, call [phone].  After hours and on weekends, please call the GI answering service at [phone] who will take a message and have the physician on call contact you.   Following lower endoscopy (colonoscopy or flexible sigmoidoscopy):  Excessive amounts of blood in the stool  Significant tenderness or worsening of abdominal pains  Swelling of the abdomen that is new, acute  Fever of 100F or higher   FOLLOW UP: If any biopsies were taken you will be contacted by phone or by letter within the next 1-3 weeks.  Call your gastroenterologist if you have not heard about the biopsies in 3 weeks.  Our staff will call the home number listed on your records the next business day following your procedure to check on you and address any questions or concerns that you may have at that time regarding the information given to you following your procedure. This is a courtesy call and so if there is no answer at the home number and we have not heard from you through the emergency physician on call, we will assume that you have   returned to your regular daily activities without incident.  SIGNATURES/CONFIDENTIALITY: You and/or your care partner have signed paperwork which will be entered into your electronic medical record.  These signatures attest to the fact that that the information above on your After Visit Summary has been reviewed and is understood.  Full responsibility of the confidentiality of this discharge information lies with you and/or your care-partner.  Polyp-handout given  Repeat colonoscopy in 5 years.

## 2013-07-11 ENCOUNTER — Telehealth: Payer: Self-pay

## 2013-07-11 NOTE — Telephone Encounter (Signed)
  Follow up Call-  Call back number 07/10/2013  Post procedure Call Back phone  # [phone]  Permission to leave phone message Yes     Patient questions:  Do you have a fever, pain , or abdominal swelling? no Pain Score  0 *  Have you tolerated food without any problems? yes  Have you been able to return to your normal activities? yes  Do you have any questions about your discharge instructions: Diet   no Medications  no Follow up visit  no  Do you have questions or concerns about your Care? no  Actions: * If pain score is 4 or above: No action needed, pain <4.  No problems per the pt. Maw

## 2013-07-23 ENCOUNTER — Encounter: Payer: Self-pay | Admitting: Gastroenterology

## 2013-08-30 ENCOUNTER — Other Ambulatory Visit: Payer: Medicare Other

## 2013-08-30 DIAGNOSIS — C189 Malignant neoplasm of colon, unspecified: Secondary | ICD-10-CM | POA: Diagnosis not present

## 2013-09-03 ENCOUNTER — Telehealth: Payer: Self-pay | Admitting: Oncology

## 2013-09-03 ENCOUNTER — Ambulatory Visit (HOSPITAL_BASED_OUTPATIENT_CLINIC_OR_DEPARTMENT_OTHER): Payer: Medicare Other | Admitting: Oncology

## 2013-09-03 VITALS — BP 110/67 | HR 65 | Temp 97.1°F | Resp 18 | Ht 69.0 in | Wt 155.6 lb

## 2013-09-03 DIAGNOSIS — C182 Malignant neoplasm of ascending colon: Secondary | ICD-10-CM | POA: Diagnosis not present

## 2013-09-03 DIAGNOSIS — F17200 Nicotine dependence, unspecified, uncomplicated: Secondary | ICD-10-CM

## 2013-09-03 DIAGNOSIS — C189 Malignant neoplasm of colon, unspecified: Secondary | ICD-10-CM

## 2013-09-03 NOTE — Progress Notes (Signed)
    Cancer Center    OFFICE PROGRESS NOTE   INTERVAL HISTORY:   Tim Obrien returns for scheduled followup of colon cancer. He feels well. No specific complaint. He continues to smoke. He underwent a colonoscopy 07/10/2013. A polyp was removed from the transverse colon. The pathology revealed benign polypoid colonic mucosa.  Objective:  Vital signs in last 24 hours:  Blood pressure 110/67, pulse 65, temperature 97.1 F (36.2 C), temperature source Oral, resp. rate 18, height 5\' 9"  (1.753 m), weight 155 lb 9.6 oz (70.58 kg), SpO2 100.00%.    HEENT: Neck without mass Lymphatics: No cervical, supraclavicular, axillary, or inguinal nodes Resp: Distant breath sounds, no respiratory distress Cardio: Regular rate and rhythm GI: No hepatosplenomegaly, nontender, no mass Vascular: No leg edema   Lab Results:  CEA on 08/30/2013-3.0   Medications: I have reviewed the patient's current medications.  Assessment/Plan: 1. Stage IIIC colon cancer (T4 N2) status post right colectomy February 28, 2009. He completed 12 cycles of adjuvant FOLFOX chemotherapy. Oxaliplatin was deleted from cycles #6, 7, 10, 11, and 12. Restaging CT evaluation Feb 24, 2012, showed no evidence for recurrent colon cancer.  Last colonoscopy 07/10/2013 2. 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 The platelet count was mildly decreased prior to beginning chemotherapy. The platelet count was in the low normal range on 08/24/2011. 3. Ongoing tobacco use. We discussed smoking cessation. 4. History of Oxaliplatin neuropathy.   Disposition:  He remains in clinical remission from colon cancer. He will return for an office visit and CEA in 6 months. We discussed smoking cessation. He will followup with his primary physician at the [facility].   Thornton Papas, MD  09/03/2013  11:09 AM

## 2013-09-03 NOTE — Telephone Encounter (Signed)
Gave pt appt for lab and Md on June 2015 °

## 2013-12-26 ENCOUNTER — Telehealth: Payer: Self-pay | Admitting: Physician Assistant

## 2013-12-26 NOTE — Telephone Encounter (Signed)
left message that per AB to chge lab appt to 8:30/adv pt of the new time & date

## 2014-03-01 ENCOUNTER — Other Ambulatory Visit (HOSPITAL_BASED_OUTPATIENT_CLINIC_OR_DEPARTMENT_OTHER): Payer: Medicare Other

## 2014-03-01 DIAGNOSIS — C189 Malignant neoplasm of colon, unspecified: Secondary | ICD-10-CM | POA: Diagnosis not present

## 2014-03-01 DIAGNOSIS — C182 Malignant neoplasm of ascending colon: Secondary | ICD-10-CM

## 2014-03-01 LAB — CEA: CEA: 3 ng/mL (ref 0.0–5.0)

## 2014-03-04 ENCOUNTER — Ambulatory Visit (HOSPITAL_BASED_OUTPATIENT_CLINIC_OR_DEPARTMENT_OTHER): Payer: Medicare Other | Admitting: Nurse Practitioner

## 2014-03-04 VITALS — BP 111/62 | HR 60 | Temp 98.5°F | Resp 18 | Ht 69.0 in | Wt 154.9 lb

## 2014-03-04 DIAGNOSIS — Z85038 Personal history of other malignant neoplasm of large intestine: Secondary | ICD-10-CM

## 2014-03-04 DIAGNOSIS — C189 Malignant neoplasm of colon, unspecified: Secondary | ICD-10-CM

## 2014-03-04 NOTE — Progress Notes (Signed)
  Tim Obrien   Diagnosis:  Colon cancer.  INTERVAL HISTORY:   Tim Obrien returns as scheduled. He feels well. No abdominal pain. Bowels moving regularly. No bloody or black stools. No nausea or vomiting. He denies shortness of breath. He has a good appetite and good energy level. He exercises regularly. No numbness or tingling in his hands or feet. He continues to smoke.  Objective:  Vital signs in last 24 hours:  Blood pressure 111/62, pulse 60, temperature 98.5 F (36.9 C), temperature source Oral, resp. rate 18, height 5\' 9"  (1.753 m), weight 154 lb 14.4 oz (70.262 kg).    HEENT: No thrush or ulcerations. Lymphatics: No palpable cervical, supraclavicular, axillary or inguinal lymph nodes. Resp: Lungs clear. Cardio: Regular cardiac rhythm. GI: Soft and nontender. No organomegaly. No mass. Vascular: No leg edema.   Lab Results:  Lab Results  Component Value Date   WBC 6.9 08/24/2011   HGB 14.8 08/24/2011   HCT 43.2 08/24/2011   MCV 93.3 08/24/2011   PLT 151 08/24/2011   NEUTROABS 5.0 08/24/2011   03/01/2014 CEA 3.0.  Imaging:  No results found.  Medications: I have reviewed the patient's current medications.  Assessment/Plan: 1. Stage IIIC colon cancer (T4 N2) status post right colectomy February 28, 2009. He completed 12 cycles of adjuvant FOLFOX chemotherapy. Oxaliplatin was deleted from cycles #6, 7, 10, 11, and 12. Restaging CT evaluation Feb 24, 2012 showed no evidence for recurrent colon cancer. Last colonoscopy 07/10/2013. 2. 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 The platelet count was mildly decreased prior to beginning chemotherapy. The platelet count was in the low normal range on 08/24/2011. 3. Ongoing tobacco use. We discussed smoking cessation. 4. History of oxaliplatin neuropathy.    Disposition: He remains in clinical remission from colon cancer. He is now 5 years out from  diagnosis. We did not schedule formal followup. Dr. Benay Spice recommends a CEA on an annual basis for the next 3 years. Tim Obrien will arrange to have this done with his primary physician at the [place]. He understands the importance of remaining up-to-date on his colonoscopy. We again discussed smoking cessation at today's visit.  He and his daughter understand they can contact the office at any time with questions or concerns.  Plan reviewed with Dr. Benay Spice.    Owens Shark ANP/GNP-BC   03/04/2014  9:45 AM

## 2014-09-04 ENCOUNTER — Other Ambulatory Visit: Payer: Self-pay | Admitting: Nurse Practitioner

## 2014-09-04 ENCOUNTER — Telehealth: Payer: Self-pay | Admitting: *Deleted

## 2014-09-04 ENCOUNTER — Telehealth: Payer: Self-pay | Admitting: Oncology

## 2014-09-04 DIAGNOSIS — C189 Malignant neoplasm of colon, unspecified: Secondary | ICD-10-CM

## 2014-09-04 NOTE — Telephone Encounter (Signed)
s/w pt dtr re appt for lab tomorrow (09/05/14). per pt appt given to dtr.

## 2014-09-04 NOTE — Telephone Encounter (Signed)
Pt's daughter called states that pt got labs done at [facility] and the CEA came back at 3.2; requesting appt with Dr. Benay Spice.  MD made aware.

## 2014-09-05 ENCOUNTER — Other Ambulatory Visit (HOSPITAL_BASED_OUTPATIENT_CLINIC_OR_DEPARTMENT_OTHER): Payer: Medicare Other

## 2014-09-05 DIAGNOSIS — C189 Malignant neoplasm of colon, unspecified: Secondary | ICD-10-CM | POA: Diagnosis not present

## 2014-09-05 DIAGNOSIS — Z85038 Personal history of other malignant neoplasm of large intestine: Secondary | ICD-10-CM | POA: Diagnosis not present

## 2014-09-06 ENCOUNTER — Telehealth: Payer: Self-pay | Admitting: *Deleted

## 2014-09-06 LAB — CEA: CEA: 2.9 ng/mL (ref 0.0–5.0)

## 2014-09-06 NOTE — Telephone Encounter (Signed)
-----   Message from Tania Ade, RN sent at 09/06/2014  2:48 PM EST -----   ----- Message -----    From: Ladell Pier, MD    Sent: 09/06/2014   9:31 AM      To: Tania Ade, RN, Ludwig Lean, RN, #  Please call patient, cea is normal

## 2014-09-06 NOTE — Telephone Encounter (Signed)
Called and informed patient of normal cea.  Per Dr. Sherrill.  Patient verbalized understanding.  

## 2014-09-06 NOTE — Telephone Encounter (Signed)
Error

## 2016-01-01 ENCOUNTER — Ambulatory Visit (INDEPENDENT_AMBULATORY_CARE_PROVIDER_SITE_OTHER): Payer: Medicare Other | Admitting: Physician Assistant

## 2016-01-01 VITALS — BP 132/78 | HR 98 | Temp 98.1°F | Resp 18 | Ht 68.0 in | Wt 150.0 lb

## 2016-01-01 DIAGNOSIS — J019 Acute sinusitis, unspecified: Secondary | ICD-10-CM

## 2016-01-01 DIAGNOSIS — R05 Cough: Secondary | ICD-10-CM | POA: Diagnosis not present

## 2016-01-01 DIAGNOSIS — R059 Cough, unspecified: Secondary | ICD-10-CM

## 2016-01-01 DIAGNOSIS — H9202 Otalgia, left ear: Secondary | ICD-10-CM

## 2016-01-01 MED ORDER — AMOXICILLIN-POT CLAVULANATE 875-125 MG PO TABS: 1.0000 | ORAL_TABLET | Freq: Two times a day (BID) | ORAL | Status: DC

## 2016-01-01 NOTE — Progress Notes (Signed)
Subjective:    Patient ID: Tim Obrien, male    DOB: [birth year], 70 y.o.   MRN: [id number]  HPI  Tim Obrien is a 70 yo caucasian male no significant pmh here today for ear pain, nasal congestion, and cough.   Patients states that approx 1 weeks ago his congestion and cough started. He endorses a productive cough with green mucus. He also complains of headache, sinus pressure, green-yellow rhinorrhea, sore throat, weakness and muscle aches. He denies any fever (max temperature 99.9), chills, N/V/D, abd pain. Patient states a family member in the house has been sick with fevers. He has been taking tylenol and Alka-Seltzer for symptom relief.  Patient also complains of left ear pain and fullness that started 1 day ago. He tried swimmers ear drops last night with some relief. The ear pain has resolved this AM. He states that his hearing is reduced in his left ear and he reports tinnitus as well.    Current Outpatient Prescriptions on File Prior to Visit  Medication Sig Dispense Refill  . Multiple Vitamins-Minerals (CENTRUM SILVER PO) Take 1 tablet by mouth daily.      . simvastatin (ZOCOR) 40 MG tablet Take 20 mg by mouth Daily.     No current facility-administered medications on file prior to visit.   Allergies  Allergen Reactions  . Aspirin     REACTION: nausea and vomiting    Review of Systems  Constitutional: Negative for fever and chills.  HENT: Positive for congestion, ear pain, hearing loss, postnasal drip, rhinorrhea, sinus pressure, sore throat and tinnitus. Negative for ear discharge.   Eyes: Negative for discharge, redness and itching.  Respiratory: Positive for cough. Negative for shortness of breath and wheezing.   Cardiovascular: Negative for chest pain.  Gastrointestinal: Negative for nausea, vomiting, abdominal pain and diarrhea.  Musculoskeletal: Positive for myalgias.  Neurological: Positive for weakness and headaches. Negative for dizziness and syncope.         Objective:   Physical Exam  Constitutional: He is oriented to person, place, and time. He appears well-developed and well-nourished. He does not appear ill. No distress.  HENT:  Head: Normocephalic and atraumatic.  Mouth/Throat: No oropharyngeal exudate, posterior oropharyngeal edema or posterior oropharyngeal erythema.  Large amount of cerumen in ears bilaterally, unable to access TM will irrigate both ears and re access.  Eyes: Conjunctivae are normal. Pupils are equal, round, and reactive to light.  Neck: Normal range of motion. Neck supple.  Cardiovascular: Normal rate, regular rhythm, normal heart sounds and intact distal pulses.   Pulmonary/Chest: Effort normal. He has decreased breath sounds (Patient is a 10 pack year smoker). He has no wheezes. He has no rales.  Abdominal: Soft. Bowel sounds are normal.  Lymphadenopathy:    He has no cervical adenopathy.  Neurological: He is alert and oriented to person, place, and time.  Skin: Skin is warm and dry.  Psychiatric: He has a normal mood and affect. His behavior is normal. Thought content normal.          Assessment & Plan:  1. Left ear pain Likely due to cerumen impaction. Ear irrigated bilaterally with large amount of cerumen removed. Patient subjectively feels better.  2. Cough 3. Subacute sinusitis, unspecified location Patient has a history of sinusitis and has risk factor for recurrent sinusitis such as 10 pack year smoker. Sinus congestion with green-yellow mucus present for 7-10 days without relief will treat with abx. Patient encouraged to follow up if symptoms do  not resolve in 10 days. - amoxicillin-clavulanate (AUGMENTIN) 875-125 MG tablet; Take 1 tablet by mouth 2 (two) times daily.  Dispense: 20 tablet; Refill: 0  Melina Schools PA-S 01/01/2016

## 2016-01-01 NOTE — Patient Instructions (Addendum)
Please get plenty of rest and drink fluids. Take antibiotics as prescribed for sinus infection. If symptoms do not resolve please call or return to clinic.    IF you received an x-ray today, you will receive an invoice from [facility]. Please contact [facility] at [phone] with questions or concerns regarding your invoice.   IF you received labwork today, you will receive an invoice from [employer]. Please contact [facility] at [phone] with questions or concerns regarding your invoice.   Our billing staff will not be able to assist you with questions regarding bills from these companies.  You will be contacted with the lab results as soon as they are available. The fastest way to get your results is to activate your My Chart account. Instructions are located on the last page of this paperwork. If you have not heard from [place] regarding the results in 2 weeks, please contact this office.

## 2016-01-02 ENCOUNTER — Encounter: Payer: Self-pay | Admitting: Physician Assistant

## 2016-01-02 NOTE — Progress Notes (Signed)
Patient ID: Tim Obrien, male    DOB: 03-03-1946, 70 y.o.   MRN: [id number]  PCP: PROVIDER NOT IN SYSTEM  Subjective:   Chief Complaint  Patient presents with  . Ear Pain    left  . Nasal Congestion  . Cough    HPI Presents for evaluation of ear pain, nasal congestion, and cough.   Patients states that approx 1 weeks ago his congestion and cough started. He endorses a productive cough with green mucus. He also complains of headache, sinus pressure, green-yellow rhinorrhea, sore throat, weakness and muscle aches. He denies any fever (max temperature 99.9), chills, N/V/D, abd pain. Patient states a family member in the house has been sick with fevers. He has been taking tylenol and Alka-Seltzer for symptom relief.  Patient also complains of left ear pain and fullness that started 1 day ago. He tried swimmers ear drops last night with some relief. The ear pain has resolved this AM. He states that his hearing is reduced in his left ear and he reports tinnitus as well.     Review of Systems Constitutional: Negative for fever and chills.  HENT: Positive for congestion, ear pain, hearing loss, postnasal drip, rhinorrhea, sinus pressure, sore throat and tinnitus. Negative for ear discharge.  Eyes: Negative for discharge, redness and itching.  Respiratory: Positive for cough. Negative for shortness of breath and wheezing.  Cardiovascular: Negative for chest pain.  Gastrointestinal: Negative for nausea, vomiting, abdominal pain and diarrhea.  Musculoskeletal: Positive for myalgias.  Neurological: Positive for weakness and headaches. Negative for dizziness and syncope.     Patient Active Problem List   Diagnosis Date Noted  . Left ear pain 01/01/2016  . Cough 01/01/2016  . Malignant neoplasm of colon ([place]) 08/31/2011     Prior to Admission medications   Medication Sig Start Date End Date Taking? Authorizing Provider  Multiple Vitamins-Minerals (CENTRUM SILVER PO) Take 1  tablet by mouth daily.     Yes Historical Provider, MD  simvastatin (ZOCOR) 40 MG tablet Take 20 mg by mouth Daily. 06/17/11  Yes Historical Provider, MD     Allergies  Allergen Reactions  . Aspirin     REACTION: nausea and vomiting       Objective:  Physical Exam  Constitutional: He is oriented to person, place, and time. He appears well-developed and well-nourished. He is active and cooperative. No distress.  BP 132/78 mmHg  Pulse 98  Temp(Src) 98.1 F (36.7 C)  Resp 18  Ht 5\' 8"  (1.727 m)  Wt 150 lb (68.04 kg)  BMI 22.81 kg/m2  SpO2 94%  HENT:  Head: Normocephalic and atraumatic.  Right Ear: Hearing and external ear normal.  Left Ear: Hearing and external ear normal.  Nose: Mucosal edema present. No rhinorrhea. No epistaxis.  Mouth/Throat: Uvula is midline, oropharynx is clear and moist and mucous membranes are normal. He has dentures (fully edentulous; fully compensated). No oral lesions.  Both canals with large cerumen impaction,resolved with irrigation. Mild erythema of the RIGHT canal noted after irrigation. Both TMs are normal.  Eyes: Conjunctivae are normal. No scleral icterus.  Neck: Normal range of motion. Neck supple. No thyromegaly present.  Cardiovascular: Normal rate, regular rhythm and normal heart sounds.   Pulses:      Radial pulses are 2+ on the right side, and 2+ on the left side.  Pulmonary/Chest: Effort normal and breath sounds normal.  Lymphadenopathy:       Head (right side): No tonsillar, no preauricular, no  posterior auricular and no occipital adenopathy present.       Head (left side): No tonsillar, no preauricular, no posterior auricular and no occipital adenopathy present.    He has no cervical adenopathy.       Right: No supraclavicular adenopathy present.       Left: No supraclavicular adenopathy present.  Neurological: He is alert and oriented to person, place, and time. No sensory deficit.  Skin: Skin is warm, dry and intact. No rash noted.  No cyanosis or erythema. Nails show no clubbing.  Psychiatric: He has a normal mood and affect. His speech is normal and behavior is normal.           Assessment & Plan:   1. Left ear pain Due to cerumen impaction. Resolved with irrigation.  2. Cough Due to post-nasal drainage.  3. Subacute sinusitis, unspecified location Encouraged smoking cessation. Supportive care.  Anticipatory guidance.  RTC if symptoms worsen/persist. - amoxicillin-clavulanate (AUGMENTIN) 875-125 MG tablet; Take 1 tablet by mouth 2 (two) times daily.  Dispense: 20 tablet; Refill: 0   Fara Chute, PA-C Physician Assistant-Certified [facility]

## 2016-01-19 ENCOUNTER — Encounter: Payer: Self-pay | Admitting: Gastroenterology

## 2016-08-07 ENCOUNTER — Ambulatory Visit (INDEPENDENT_AMBULATORY_CARE_PROVIDER_SITE_OTHER): Payer: Medicare Other

## 2016-08-07 ENCOUNTER — Ambulatory Visit (INDEPENDENT_AMBULATORY_CARE_PROVIDER_SITE_OTHER): Payer: Medicare Other | Admitting: Family Medicine

## 2016-08-07 VITALS — BP 122/70 | HR 108 | Temp 97.8°F | Resp 16 | Ht 68.0 in | Wt 150.0 lb

## 2016-08-07 DIAGNOSIS — J019 Acute sinusitis, unspecified: Secondary | ICD-10-CM | POA: Diagnosis not present

## 2016-08-07 DIAGNOSIS — Z720 Tobacco use: Secondary | ICD-10-CM | POA: Diagnosis not present

## 2016-08-07 DIAGNOSIS — R0609 Other forms of dyspnea: Secondary | ICD-10-CM

## 2016-08-07 DIAGNOSIS — R05 Cough: Secondary | ICD-10-CM

## 2016-08-07 DIAGNOSIS — R059 Cough, unspecified: Secondary | ICD-10-CM

## 2016-08-07 MED ORDER — BENZONATATE 100 MG PO CAPS: 100.0000 mg | ORAL_CAPSULE | Freq: Three times a day (TID) | ORAL | 0 refills | Status: DC | PRN

## 2016-08-07 MED ORDER — LEVOFLOXACIN 500 MG PO TABS: 500.0000 mg | ORAL_TABLET | Freq: Every day | ORAL | 0 refills | Status: DC

## 2016-08-07 NOTE — Patient Instructions (Addendum)
You likely have a sinus infection, but with the increasing cough and shortness of breath,  early pneumonia is also possible. For that reason I started you on Levaquin 1 pill once per day for 10 days. If you have any new muscke tendon pain or swelling, return right away as tendon problems are a risk with this class of medications. For cough, Mucinex or Mucinex DM over-the-counter as needed, and I also sent in a prescription for Tessalon Perles up to 3 times per day. If you're not improving in the next 3-4 days, or any worsening sooner, return for recheck. If you do start wheezing or increasing shortness of breath, return for recheck as other treatments may be needed.  Let us know if we can help you when you're ready to quit smoking.[facility] offers smoking cessation clinics. Registration is required. To register call [phone] or register online at [email or web address]   Sinusitis, Adult Sinusitis is redness, soreness, and inflammation of the paranasal sinuses. Paranasal sinuses are air pockets within the bones of your face. They are located beneath your eyes, in the middle of your forehead, and above your eyes. In healthy paranasal sinuses, mucus is able to drain out, and air is able to circulate through them by way of your nose. However, when your paranasal sinuses are inflamed, mucus and air can become trapped. This can allow bacteria and other germs to grow and cause infection. Sinusitis can develop quickly and last only a short time (acute) or continue over a long period (chronic). Sinusitis that lasts for more than 12 weeks is considered chronic. CAUSES Causes of sinusitis include:  Allergies.  Structural abnormalities, such as displacement of the cartilage that separates your nostrils (deviated septum), which can decrease the air flow through your nose and sinuses and affect sinus drainage.  Functional abnormalities, such as when the small hairs (cilia) that line your sinuses and help remove  mucus do not work properly or are not present. SIGNS AND SYMPTOMS Symptoms of acute and chronic sinusitis are the same. The primary symptoms are pain and pressure around the affected sinuses. Other symptoms include:  Upper toothache.  Earache.  Headache.  Bad breath.  Decreased sense of smell and taste.  A cough, which worsens when you are lying flat.  Fatigue.  Fever.  Thick drainage from your nose, which often is green and may contain pus (purulent).  Swelling and warmth over the affected sinuses. DIAGNOSIS Your health care provider will perform a physical exam. During your exam, your health care provider may perform any of the following to help determine if you have acute sinusitis or chronic sinusitis:  Look in your nose for signs of abnormal growths in your nostrils (nasal polyps).  Tap over the affected sinus to check for signs of infection.  View the inside of your sinuses using an imaging device that has a light attached (endoscope). If your health care provider suspects that you have chronic sinusitis, one or more of the following tests may be recommended:  Allergy tests.  Nasal culture. A sample of mucus is taken from your nose, sent to a lab, and screened for bacteria.  Nasal cytology. A sample of mucus is taken from your nose and examined by your health care provider to determine if your sinusitis is related to an allergy. TREATMENT Most cases of acute sinusitis are related to a viral infection and will resolve on their own within 10 days. Sometimes, medicines are prescribed to help relieve symptoms of both acute and chronic  sinusitis. These may include pain medicines, decongestants, nasal steroid sprays, or saline sprays. However, for sinusitis related to a bacterial infection, your health care provider will prescribe antibiotic medicines. These are medicines that will help kill the bacteria causing the infection. Rarely, sinusitis is caused by a fungal  infection. In these cases, your health care provider will prescribe antifungal medicine. For some cases of chronic sinusitis, surgery is needed. Generally, these are cases in which sinusitis recurs more than 3 times per year, despite other treatments. HOME CARE INSTRUCTIONS  Drink plenty of water. Water helps thin the mucus so your sinuses can drain more easily.  Use a humidifier.  Inhale steam 3-4 times a day (for example, sit in the bathroom with the shower running).  Apply a warm, moist washcloth to your face 3-4 times a day, or as directed by your health care provider.  Use saline nasal sprays to help moisten and clean your sinuses.  Take medicines only as directed by your health care provider.  If you were prescribed either an antibiotic or antifungal medicine, finish it all even if you start to feel better. SEEK IMMEDIATE MEDICAL CARE IF:  You have increasing pain or severe headaches.  You have nausea, vomiting, or drowsiness.  You have swelling around your face.  You have vision problems.  You have a stiff neck.  You have difficulty breathing.   This information is not intended to replace advice given to you by your health care provider. Make sure you discuss any questions you have with your health care provider.   Document Released: 09/13/2005 Document Revised: 10/04/2014 Document Reviewed: 09/28/2011 Elsevier Interactive Patient Education 2016 Elsevier Inc.  Cough, Adult Coughing is a reflex that clears your throat and your airways. Coughing helps to heal and protect your lungs. It is normal to cough occasionally, but a cough that happens with other symptoms or lasts a long time may be a sign of a condition that needs treatment. A cough may last only 2-3 weeks (acute), or it may last longer than 8 weeks (chronic). CAUSES Coughing is commonly caused by:  Breathing in substances that irritate your lungs.  A viral or bacterial respiratory  infection.  Allergies.  Asthma.  Postnasal drip.  Smoking.  Acid backing up from the stomach into the esophagus (gastroesophageal reflux).  Certain medicines.  Chronic lung problems, including COPD (or rarely, lung cancer).  Other medical conditions such as heart failure. HOME CARE INSTRUCTIONS  Pay attention to any changes in your symptoms. Take these actions to help with your discomfort:  Take medicines only as told by your health care provider.  If you were prescribed an antibiotic medicine, take it as told by your health care provider. Do not stop taking the antibiotic even if you start to feel better.  Talk with your health care provider before you take a cough suppressant medicine.  Drink enough fluid to keep your urine clear or pale yellow.  If the air is dry, use a cold steam vaporizer or humidifier in your bedroom or your home to help loosen secretions.  Avoid anything that causes you to cough at work or at home.  If your cough is worse at night, try sleeping in a semi-upright position.  Avoid cigarette smoke. If you smoke, quit smoking. If you need help quitting, ask your health care provider.  Avoid caffeine.  Avoid alcohol.  Rest as needed. SEEK MEDICAL CARE IF:   You have new symptoms.  You cough up pus.  Your  cough does not get better after 2-3 weeks, or your cough gets worse.  You cannot control your cough with suppressant medicines and you are losing sleep.  You develop pain that is getting worse or pain that is not controlled with pain medicines.  You have a fever.  You have unexplained weight loss.  You have night sweats. SEEK IMMEDIATE MEDICAL CARE IF:  You cough up blood.  You have difficulty breathing.  Your heartbeat is very fast.   This information is not intended to replace advice given to you by your health care provider. Make sure you discuss any questions you have with your health care provider.   Document Released:  03/12/2011 Document Revised: 06/04/2015 Document Reviewed: 11/20/2014 Elsevier Interactive Patient Education 2016 [employer].    IF you received an x-ray today, you will receive an invoice from [facility]. Please contact [facility] at [phone] with questions or concerns regarding your invoice.   IF you received labwork today, you will receive an invoice from [employer]. Please contact [facility] at [phone] with questions or concerns regarding your invoice.   Our billing staff will not be able to assist you with questions regarding bills from these companies.  You will be contacted with the lab results as soon as they are available. The fastest way to get your results is to activate your My Chart account. Instructions are located on the last page of this paperwork. If you have not heard from [place] regarding the results in 2 weeks, please contact this office.
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
## 2016-09-02 DIAGNOSIS — Z23 Encounter for immunization: Secondary | ICD-10-CM | POA: Diagnosis not present

## 2016-12-17 ENCOUNTER — Ambulatory Visit (INDEPENDENT_AMBULATORY_CARE_PROVIDER_SITE_OTHER): Payer: Medicare Other | Admitting: Physician Assistant

## 2016-12-17 VITALS — BP 150/74 | HR 64 | Temp 98.3°F | Resp 17 | Ht 68.0 in | Wt 151.0 lb

## 2016-12-17 DIAGNOSIS — N471 Phimosis: Secondary | ICD-10-CM

## 2016-12-17 DIAGNOSIS — R21 Rash and other nonspecific skin eruption: Secondary | ICD-10-CM

## 2016-12-17 MED ORDER — FLUCONAZOLE 150 MG PO TABS: 150.0000 mg | ORAL_TABLET | Freq: Once | ORAL | 0 refills | Status: AC

## 2016-12-17 MED ORDER — CLOTRIMAZOLE 1 % EX CREA: 1.0000 "application " | TOPICAL_CREAM | Freq: Two times a day (BID) | CUTANEOUS | 0 refills | Status: DC

## 2016-12-17 NOTE — Patient Instructions (Addendum)
For yeast infection, please take oral diflucan today. Use lotrimin cream on the affected area two times a day. Wash the area very well with soap and water before applying the lotrimin cream. I would like you to follow up with me next Wednesday for reevaluation. If in the meantime you develop the inability to urinate due to the foreskin or any pain go to the emergency department immediately. If you develop any redness, warmth, fever or chills please call our office.  Also just so you are aware the diflucan can interact with simvastatin, I would recommend taking half the dose of simvastatin over the next few days to avoid any interactions. If you have any muscle aches or dark urine please call our office or seek care immediately.   Genital Yeast Infection, Male In men, a genital yeast infection is a condition that causes soreness, swelling, and redness (inflammation) of the head of the penis (glans penis). This type of infection is also called balanitis. A genital yeast infection can be spread through sexual contact, but it can also develop without sexual contact. If the infection is not treated properly, it is likely to come back. What are the causes? This condition is caused by a change in the normal balance of the yeast and bacteria that live on the skin. This change causes an overgrowth of yeast, which causes the inflammation. Many types of yeast can cause this infection, but candida is the most common. What increases the risk? This condition is more likely to develop in a man if:  He takes antibiotic medicines.  He has diabetes.  He is exposed to the infection by a sexual partner.  He is not circumcised.  He has a weak defense (immune) system.  He has been taking steroid medicines for a long time.  He has poor hygiene. What are the signs or symptoms? Symptoms of this condition include:  Itching.  Dry, red, or cracked skin on the penis.  Swelling.  Pain while urinating or  difficulty urinating.  Thick, bad-smelling discharge on the penis. How is this diagnosed? This condition is diagnosed with a medical history and physical exam. Your health care provider may examine a sample of any discharge from the penis and send the sample for testing. You may also have tests, including:  A urine test to rule out a urinary tract infection (UTI).  A blood test to rule out diabetes. How is this treated? This condition is treated with antifungal cream or pills along with self-care at home. Antifungal medicines may be prescribed to you or they may be available over-the-counter. For men who are not circumcised, circumcision may be recommended to control infections that return and are difficult to treat. Follow these instructions at home:  Take or apply over-the-counter and prescription medicines only as told by your health care provider.  Do not have sex until your health care provider has approved. Tell your sexual partner that you have a yeast infection. That person should go to his or her health care provider if he or she develops symptoms.  Eat more yogurt. This may help to keep your yeast infection from returning.  Wash your penis with soap and water every day. If you are not circumcised, pull back the foreskin to wash. Make sure to dry your penis completely after washing.  Wear breathable, cotton underwear.  Keep your underwear clean and dry.  If you have diabetes, keep your blood sugar levels under strict control. Contact a health care provider if:  You have a fever.  Your symptoms go away and then return.  Your symptoms do not get better with treatment.  Your symptoms get worse.  You have new symptoms. Get help right away if:  Your swelling and inflammation become so severe that you cannot urinate. This information is not intended to replace advice given to you by your health care provider. Make sure you discuss any questions you have with your health  care provider. Document Released: 10/21/2004 Document Revised: 02/19/2016 Document Reviewed: 03/17/2015 Elsevier Interactive Patient Education  2017 [employer].    IF you received an x-ray today, you will receive an invoice from [facility]. Please contact [facility] at [phone] with questions or concerns regarding your invoice.   IF you received labwork today, you will receive an invoice from [place]. Please contact [facility] at [phone] with questions or concerns regarding your invoice.   Our billing staff will not be able to assist you with questions regarding bills from these companies.  You will be contacted with the lab results as soon as they are available. The fastest way to get your results is to activate your My Chart account. Instructions are located on the last page of this paperwork. If you have not heard from [place] regarding the results in 2 weeks, please contact this office.

## 2016-12-17 NOTE — Progress Notes (Signed)
12/17/2016 at 9:41 PM  Tim Obrien / DOB: 03-15-1946 / MRN: [id number]  The patient has Malignant neoplasm of colon ([place]); Left ear pain; and Cough on his problem list.  SUBJECTIVE  Tim Obrien is a 71 y.o. male who complains of penile foreskin itching x 3 weeks.  For the past 2 weeks has been unable to retract his foreskin. He can still urinate without obstruction. Denies pain, dysuria, urinary frequency, and hematuria. Has treid OTC cream and it helped with the itching but he only used it for three times.  Pt  is sexually active with monogamous partner. He has not had any recent antibiotic use. Has no history of diabetes.  He  has a past medical history of Cancer ([place]) and Hyperlipidemia.    Medications reviewed and updated by myself where necessary, and exist elsewhere in the encounter.   Mr. Kidane is allergic to aspirin. He  reports that he has been smoking.  He has a 57.00 pack-year smoking history. He has never used smokeless tobacco. He reports that he drinks alcohol. He reports that he does not use drugs. He  reports that he does not engage in sexual activity. The patient  has a past surgical history that includes Colon surgery and Wrist surgery.  His family history is not on file.  Review of Systems  Constitutional: Negative for chills, diaphoresis and fever.    OBJECTIVE  His  height is 5\' 8"  (1.727 m) and weight is 151 lb (68.5 kg). His oral temperature is 98.3 F (36.8 C). His blood pressure is 150/74 (abnormal) and his pulse is 64. His respiration is 17 and oxygen saturation is 94%.  The patient's body mass index is 22.96 kg/m.  Physical Exam  Constitutional: He is oriented to person, place, and time. He appears well-developed and well-nourished. No distress.  HENT:  Head: Normocephalic and atraumatic.  Eyes: Conjunctivae are normal.  Neck: Normal range of motion.  Respiratory: Effort normal.  Genitourinary: Uncircumcised. Phimosis present. No penile  tenderness. No discharge found.  Genitourinary Comments: Whitish discoloration of  most distal aspect of foreskin. Mild small erythematous papules noted on most distal aspect of dorsal aspect of foreskin.     Neurological: He is alert and oriented to person, place, and time.  Skin: Skin is warm and dry.    No results found for this or any previous visit (from the past 24 hour(s)).  ASSESSMENT & PLAN  [place] was seen today for patient says he has a yeast infection.  Diagnoses and all orders for this visit:  Phimosis       -Unable to manually retract  Rash and nonspecific skin eruption -History and PE consistent with yeast infection. There is some mild erythema on the foreskin, but it does not have an infected or cellulitis appearance. It appears mildly irritated from patient continuously rubbing. However, informed him that if he does continue to scratch/rub, it can lead to breakdown of the skin and increase his risk of bacterial infection.  -     clotrimazole (LOTRIMIN) 1 % cream; Apply 1 application topically 2 (two) times daily. -     fluconazole (DIFLUCAN) 150 MG tablet; Take 1 tablet (150 mg total) by mouth once.   Follow up in 5 days, if yeast infection symptoms not fully resolved, consider second dose of diflucan. If phimosis still present, consider referral to urology. Pt informed that in the meantime if he develops the inability to urinate due to the foreskin or any  penile pain go to the emergency department immediately. Also instructed that if he develops any penile foreskin redness, warmth, fever or chills please to call our office immediately.  Tenna Delaine, PA-C [facility] 12/17/2016 9:41 PM

## 2016-12-22 ENCOUNTER — Ambulatory Visit (INDEPENDENT_AMBULATORY_CARE_PROVIDER_SITE_OTHER): Payer: Medicare Other | Admitting: Physician Assistant

## 2016-12-22 ENCOUNTER — Encounter: Payer: Self-pay | Admitting: Physician Assistant

## 2016-12-22 VITALS — BP 127/70 | HR 78 | Temp 98.0°F | Resp 18 | Ht 68.0 in | Wt 151.8 lb

## 2016-12-22 DIAGNOSIS — B3749 Other urogenital candidiasis: Secondary | ICD-10-CM | POA: Diagnosis not present

## 2016-12-22 DIAGNOSIS — N471 Phimosis: Secondary | ICD-10-CM | POA: Diagnosis not present

## 2016-12-22 MED ORDER — FLUCONAZOLE 150 MG PO TABS: 150.0000 mg | ORAL_TABLET | Freq: Once | ORAL | 0 refills | Status: AC

## 2016-12-22 NOTE — Patient Instructions (Addendum)
I would like you to take another dose of diflucan. Continue using the lotrimin cream twice daily until you see urology. The urologist referral has been placed as urgent so you should hear from them before the end of the week. Please call Friday if you have not heard from them. Once again, if anything worsens or you are unable to urinate or have severe pain while waiting to see urology, please go immediately to the emergency room. Thank you for letting me participate in your health and well being.    IF you received an x-ray today, you will receive an invoice from [facility]. Please contact [facility] at [phone] with questions or concerns regarding your invoice.   IF you received labwork today, you will receive an invoice from [place]. Please contact [facility] at [phone] with questions or concerns regarding your invoice.   Our billing staff will not be able to assist you with questions regarding bills from these companies.  You will be contacted with the lab results as soon as they are available. The fastest way to get your results is to activate your My Chart account. Instructions are located on the last page of this paperwork. If you have not heard from [place] regarding the results in 2 weeks, please contact this office.

## 2016-12-22 NOTE — Progress Notes (Signed)
    MRN: [id number] DOB: 18-Sep-1946  Subjective:   Tim Obrien is a 71 y.o. male presenting for follow up on phimosis. Initially seen on 12/17/16 for penile itching and inability to retract foreskin. Since the last visit, he has taken one dose of diflucan. Has tried lotrimin BID daily. Reports that his itching has resovled but skin still will still not retract. It has not gotten worse but has also not gotten any better. He can still urinate without obstruction. Denies pain, dysuria, urinary frequency, and hematuria. Of note, he is still able to have morning erections but note the skin will still not retract. He has not been sexually active since this started a few weeks ago.    Tim Obrien has a current medication list which includes the following prescription(s): clotrimazole, multiple vitamins-minerals, and simvastatin. Also is allergic to aspirin.  Tim Obrien  has a past medical history of Cancer ([place]) and Hyperlipidemia. Also  has a past surgical history that includes Colon surgery and Wrist surgery.   Objective:   Vitals: BP 127/70   Pulse 78   Temp 98 F (36.7 C) (Oral)   Resp 18   Ht 5\' 8"  (1.727 m)   Wt 151 lb 12.8 oz (68.9 kg)   SpO2 95%   BMI 23.08 kg/m   Physical Exam  Constitutional: He is oriented to person, place, and time. He appears well-developed and well-nourished.  HENT:  Head: Normocephalic and atraumatic.  Eyes: Conjunctivae are normal.  Neck: Normal range of motion.  Pulmonary/Chest: Effort normal.  Genitourinary: Uncircumcised. Phimosis present. No penile tenderness. No discharge found.  Genitourinary Comments: Whitish discoloration of  most distal aspect of foreskin. Mild small erythematous papules noted on most distal aspect of dorsal aspect of foreskin.      Neurological: He is alert and oriented to person, place, and time.  Skin: Skin is warm and dry.  Psychiatric: He has a normal mood and affect.  Vitals reviewed.   No results found for this or any  previous visit (from the past 24 hour(s)).  Assessment and Plan :  1. Phimosis Not improving, will give additional Rx for diflucan. Instructed to continue lotrimin cream BID until he sees urology. Have placed urgent referral to urology. Given strict ED precautions.  - Ambulatory referral to Urology 2. Candidal balanoposthitis - fluconazole (DIFLUCAN) 150 MG tablet; Take 1 tablet (150 mg total) by mouth once.  Dispense: 1 tablet; Refill: 0  Tim Delaine, PA-C  [facility] 12/22/2016 3:35 PM

## 2017-01-10 ENCOUNTER — Other Ambulatory Visit: Payer: Self-pay | Admitting: Urology

## 2017-01-10 DIAGNOSIS — N476 Balanoposthitis: Secondary | ICD-10-CM | POA: Diagnosis not present

## 2017-01-10 DIAGNOSIS — N471 Phimosis: Secondary | ICD-10-CM | POA: Diagnosis not present

## 2017-01-18 ENCOUNTER — Encounter (HOSPITAL_BASED_OUTPATIENT_CLINIC_OR_DEPARTMENT_OTHER): Payer: Self-pay | Admitting: *Deleted

## 2017-01-18 NOTE — Progress Notes (Signed)
NPO AFTER MN.  ARRIVE AT 0845.  NEEDS HG.  

## 2017-01-24 NOTE — H&P (Signed)
CC: I have irritation and discomfort of the head of my penis.  HPI: Tim Obrien is a 71 year-old male patient who was referred by [place], PA who is here for irritation and discomfort of the head of his penis.  He first stated noticing pain on approximately 11/25/2016. His symptoms did begin gradually. His symptoms have been worse over the last year.   He was not circumcised at birth. He does have difficulty retracting his foreskin. He has had to use creams on the head of his penis. He does not have diabetes.   He is not currently having trouble urinating.   Tim Obrien is a 71 yo WM who is sent by Tenna Delaine PA for balanitis. He had the onset of penile irritation about a month ago. he has been treated with diflucan and topical agents. He continues to have issues with phimosis and is unable to retract the foreskin. He has some itching. He has no voiding complaints. He has no other GU history.      ALLERGIES: Aspirin    MEDICATIONS: Multivitamin  Tylenol  Zocor     GU PSH: None     PSH Notes: colon surgery     NON-GU PSH: None   GU PMH: None   NON-GU PMH: Colon Cancer, History Hypercholesterolemia    FAMILY HISTORY: None   SOCIAL HISTORY: Marital Status: Single Current Smoking Status: Patient smokes.   Tobacco Use Assessment Completed: Used Tobacco in last 30 days? Drinks 4+ caffeinated drinks per day.     Notes: 1 daughter    REVIEW OF SYSTEMS:    GU Review Male:   Patient reports get up at night to urinate. Patient denies frequent urination, hard to postpone urination, burning/ pain with urination, leakage of urine, stream starts and stops, trouble starting your stream, have to strain to urinate , erection problems, and penile pain.  Gastrointestinal (Upper):   Patient denies nausea, vomiting, and indigestion/ heartburn.  Gastrointestinal (Lower):   Patient denies diarrhea and constipation.  Constitutional:   Patient denies fever, night sweats, weight  loss, and fatigue.  Skin:   Patient denies skin rash/ lesion and itching.  Eyes:   Patient denies blurred vision and double vision.  Ears/ Nose/ Throat:   Patient denies sinus problems and sore throat.  Hematologic/Lymphatic:   Patient denies swollen glands and easy bruising.  Cardiovascular:   Patient denies leg swelling and chest pains.  Respiratory:   Patient denies cough and shortness of breath.  Endocrine:   Patient denies excessive thirst.  Musculoskeletal:   Patient denies back pain and joint pain.  Neurological:   Patient denies headaches and dizziness.  Psychologic:   Patient denies depression and anxiety.   VITAL SIGNS:      01/10/2017 08:14 AM  Weight 151 lb / 68.49 kg  Height 68 in / 172.72 cm  BP 143/75 mmHg  Pulse 61 /min  Temperature 96.8 F / 36 C  BMI 23.0 kg/m   GU PHYSICAL EXAMINATION:    Scrotum: No lesions. No edema. No cysts. No warts.  Epididymides: Right: no spermatocele, no masses, no cysts, no tenderness, no induration, no enlargement. Left: no spermatocele, no masses, no cysts, no tenderness, no induration, no enlargement.  Testes: No tenderness, no swelling, no enlargement left testes. No tenderness, no swelling, no enlargement right testes. Normal location left testes. Normal location right testes. No mass, no cyst, no varicocele, no hydrocele left testes. No mass, no cyst, no varicocele, no hydrocele right testes.  Urethral Meatus: Normal size. No lesion, no wart, no discharge, no polyp. Normal location.  Penis: Penis uncircumcised, phimosis. Balanitis. No foreskin warts, no cracks. No dorsal peyronie's plaques, no left corporal peyronie's plaques, no right corporal peyronie's plaques, no scarring, no shaft warts. No meatal stenosis.    MULTI-SYSTEM PHYSICAL EXAMINATION:    Constitutional: Well-nourished. No physical deformities. Normally developed. Good grooming.  Neck: Neck symmetrical, not swollen. Normal tracheal position.  Respiratory: No labored  breathing, no use of accessory muscles. RRR without murmur  Cardiovascular: Normal temperature, RRR without murmur.   Lymphatic: No enlargement of neck, axillae, groin.  Skin: No paleness, no jaundice, no cyanosis. No lesion, no ulcer, no rash.  Neurologic / Psychiatric: Oriented to time, oriented to place, oriented to person. No depression, no anxiety, no agitation.  Gastrointestinal: No hernia. Midline scar. No mass, no tenderness, no rigidity, non obese abdomen.   Musculoskeletal: Normal gait and station of head and neck.     PAST DATA REVIEWED:  Source Of History:  Patient  Records Review:   Previous Doctor Records  Urine Test Review:   Urinalysis  Notes:                     Records from [place] reviewed.    PROCEDURES:          Urinalysis - 81003 Dipstick Dipstick Cont'd  Color: Yellow Bilirubin: Neg  Appearance: Clear Ketones: Neg  Specific Gravity: 1.015 Blood: Neg  pH: 6.0 Protein: Neg  Glucose: Neg Urobilinogen: 0.2    Nitrites: Neg    Leukocyte Esterase: Neg    Notes:      ASSESSMENT:      ICD-10 Details  1 GU:   Balanoposthitis - N47.6 He has balanitis with severe phimosis and needs a circumcision. I have reviewed the risks of bleeding, infection, penile injury with sexual dysfunction, scarring, pain, thrombotic events and anesthetic complications. We will get him scheduled soon.   2   Phimosis - N47.1    PLAN:           Schedule Return Visit/Planned Activity: Next Available Appointment - Schedule Surgery

## 2017-01-25 ENCOUNTER — Ambulatory Visit (HOSPITAL_BASED_OUTPATIENT_CLINIC_OR_DEPARTMENT_OTHER): Payer: Medicare Other | Admitting: Certified Registered"

## 2017-01-25 ENCOUNTER — Ambulatory Visit (HOSPITAL_BASED_OUTPATIENT_CLINIC_OR_DEPARTMENT_OTHER)
Admission: RE | Admit: 2017-01-25 | Discharge: 2017-01-25 | Disposition: A | Discharge status: Home / Self Care | Payer: Medicare Other | Source: Ambulatory Visit | Attending: Urology | Admitting: Urology

## 2017-01-25 ENCOUNTER — Encounter (HOSPITAL_BASED_OUTPATIENT_CLINIC_OR_DEPARTMENT_OTHER): Payer: Self-pay | Admitting: *Deleted

## 2017-01-25 ENCOUNTER — Encounter (HOSPITAL_BASED_OUTPATIENT_CLINIC_OR_DEPARTMENT_OTHER)
Admission: RE | Disposition: A | Discharge status: Home / Self Care | Payer: Self-pay | Source: Ambulatory Visit | Attending: Urology

## 2017-01-25 DIAGNOSIS — Z79899 Other long term (current) drug therapy: Secondary | ICD-10-CM | POA: Insufficient documentation

## 2017-01-25 DIAGNOSIS — N471 Phimosis: Secondary | ICD-10-CM | POA: Diagnosis not present

## 2017-01-25 DIAGNOSIS — H9202 Otalgia, left ear: Secondary | ICD-10-CM | POA: Diagnosis not present

## 2017-01-25 DIAGNOSIS — F17200 Nicotine dependence, unspecified, uncomplicated: Secondary | ICD-10-CM | POA: Diagnosis not present

## 2017-01-25 DIAGNOSIS — N476 Balanoposthitis: Secondary | ICD-10-CM | POA: Insufficient documentation

## 2017-01-25 DIAGNOSIS — E7800 Pure hypercholesterolemia, unspecified: Secondary | ICD-10-CM | POA: Diagnosis not present

## 2017-01-25 DIAGNOSIS — J449 Chronic obstructive pulmonary disease, unspecified: Secondary | ICD-10-CM | POA: Diagnosis not present

## 2017-01-25 DIAGNOSIS — C189 Malignant neoplasm of colon, unspecified: Secondary | ICD-10-CM | POA: Diagnosis not present

## 2017-01-25 HISTORY — DX: Emphysema, unspecified: J43.9

## 2017-01-25 HISTORY — DX: Frequency of micturition: R35.0

## 2017-01-25 HISTORY — PX: CIRCUMCISION: SHX1350

## 2017-01-25 HISTORY — DX: Malignant neoplasm of colon, unspecified: C18.9

## 2017-01-25 HISTORY — DX: Complete loss of teeth, unspecified cause, unspecified class: Z97.2

## 2017-01-25 HISTORY — DX: Other seasonal allergic rhinitis: J30.2

## 2017-01-25 HISTORY — DX: Phimosis: N47.1

## 2017-01-25 HISTORY — DX: Presence of dental prosthetic device (complete) (partial): K08.109

## 2017-01-25 HISTORY — DX: Nocturia: R35.1

## 2017-01-25 LAB — POCT HEMOGLOBIN-HEMACUE: HEMOGLOBIN: 14.7 g/dL (ref 13.0–17.0)

## 2017-01-25 SURGERY — CIRCUMCISION, ADULT
Anesthesia: General | Site: Penis

## 2017-01-25 MED ORDER — DEXAMETHASONE SODIUM PHOSPHATE 10 MG/ML IJ SOLN
INTRAMUSCULAR | Status: AC
  Filled 2017-01-25: qty 1

## 2017-01-25 MED ORDER — DEXAMETHASONE SODIUM PHOSPHATE 4 MG/ML IJ SOLN
INTRAMUSCULAR | Status: DC | PRN
  Administered 2017-01-25: 10 mg via INTRAVENOUS

## 2017-01-25 MED ORDER — FENTANYL CITRATE (PF) 100 MCG/2ML IJ SOLN
25.0000 ug | INTRAMUSCULAR | Status: DC | PRN
  Filled 2017-01-25: qty 1

## 2017-01-25 MED ORDER — EPHEDRINE SULFATE-NACL 50-0.9 MG/10ML-% IV SOSY
PREFILLED_SYRINGE | INTRAVENOUS | Status: DC | PRN
  Administered 2017-01-25 (×2): 10 mg via INTRAVENOUS

## 2017-01-25 MED ORDER — MORPHINE SULFATE (PF) 2 MG/ML IV SOLN
2.0000 mg | INTRAVENOUS | Status: DC | PRN
Start: 2017-01-25 — End: 2017-01-25
  Filled 2017-01-25: qty 1

## 2017-01-25 MED ORDER — HYDROCODONE-ACETAMINOPHEN 5-325 MG PO TABS: 1.0000 | ORAL_TABLET | Freq: Four times a day (QID) | ORAL | 0 refills | Status: DC | PRN

## 2017-01-25 MED ORDER — CEFAZOLIN SODIUM-DEXTROSE 2-4 GM/100ML-% IV SOLN
2.0000 g | INTRAVENOUS | Status: AC
  Administered 2017-01-25: 2 g via INTRAVENOUS
  Filled 2017-01-25: qty 100

## 2017-01-25 MED ORDER — SODIUM CHLORIDE 0.9 % IV SOLN
250.0000 mL | INTRAVENOUS | Status: DC | PRN
  Filled 2017-01-25: qty 250

## 2017-01-25 MED ORDER — SODIUM CHLORIDE 0.9% FLUSH
3.0000 mL | INTRAVENOUS | Status: DC | PRN
  Filled 2017-01-25: qty 3

## 2017-01-25 MED ORDER — LACTATED RINGERS IV SOLN
INTRAVENOUS | Status: DC
  Administered 2017-01-25: 10:00:00 via INTRAVENOUS
  Filled 2017-01-25: qty 1000

## 2017-01-25 MED ORDER — LIDOCAINE 2% (20 MG/ML) 5 ML SYRINGE
INTRAMUSCULAR | Status: DC | PRN
Start: 2017-01-25 — End: 2017-01-25
  Administered 2017-01-25: 60 mg via INTRAVENOUS

## 2017-01-25 MED ORDER — OXYCODONE HCL 5 MG PO TABS
5.0000 mg | ORAL_TABLET | ORAL | Status: DC | PRN
  Filled 2017-01-25: qty 2

## 2017-01-25 MED ORDER — FENTANYL CITRATE (PF) 100 MCG/2ML IJ SOLN
INTRAMUSCULAR | Status: DC | PRN
  Administered 2017-01-25 (×2): 25 ug via INTRAVENOUS
  Administered 2017-01-25: 50 ug via INTRAVENOUS

## 2017-01-25 MED ORDER — MIDAZOLAM HCL 2 MG/2ML IJ SOLN
INTRAMUSCULAR | Status: AC
  Filled 2017-01-25: qty 2

## 2017-01-25 MED ORDER — ONDANSETRON HCL 4 MG/2ML IJ SOLN
INTRAMUSCULAR | Status: DC | PRN
  Administered 2017-01-25: 4 mg via INTRAVENOUS

## 2017-01-25 MED ORDER — LIDOCAINE 2% (20 MG/ML) 5 ML SYRINGE
INTRAMUSCULAR | Status: AC
  Filled 2017-01-25: qty 5

## 2017-01-25 MED ORDER — OXYCODONE HCL 5 MG/5ML PO SOLN
5.0000 mg | Freq: Once | ORAL | Status: DC | PRN
  Filled 2017-01-25: qty 5

## 2017-01-25 MED ORDER — OXYCODONE HCL 5 MG PO TABS
5.0000 mg | ORAL_TABLET | Freq: Once | ORAL | Status: DC | PRN
  Filled 2017-01-25: qty 1

## 2017-01-25 MED ORDER — PROMETHAZINE HCL 25 MG/ML IJ SOLN
6.2500 mg | INTRAMUSCULAR | Status: DC | PRN
  Filled 2017-01-25: qty 1

## 2017-01-25 MED ORDER — BUPIVACAINE HCL (PF) 0.25 % IJ SOLN
INTRAMUSCULAR | Status: DC | PRN
  Administered 2017-01-25: 6 mL

## 2017-01-25 MED ORDER — FENTANYL CITRATE (PF) 100 MCG/2ML IJ SOLN
INTRAMUSCULAR | Status: AC
  Filled 2017-01-25: qty 2

## 2017-01-25 MED ORDER — CEFAZOLIN SODIUM-DEXTROSE 2-4 GM/100ML-% IV SOLN
INTRAVENOUS | Status: AC
  Filled 2017-01-25: qty 100

## 2017-01-25 MED ORDER — SODIUM CHLORIDE 0.9% FLUSH
3.0000 mL | Freq: Two times a day (BID) | INTRAVENOUS | Status: DC
  Filled 2017-01-25: qty 3

## 2017-01-25 MED ORDER — LACTATED RINGERS IV SOLN
INTRAVENOUS | Status: DC
Start: 2017-01-25 — End: 2017-01-25
  Filled 2017-01-25: qty 1000

## 2017-01-25 MED ORDER — ONDANSETRON HCL 4 MG/2ML IJ SOLN
INTRAMUSCULAR | Status: AC
  Filled 2017-01-25: qty 2

## 2017-01-25 MED ORDER — ACETAMINOPHEN 325 MG PO TABS
650.0000 mg | ORAL_TABLET | ORAL | Status: DC | PRN
  Filled 2017-01-25: qty 2

## 2017-01-25 MED ORDER — MEPERIDINE HCL 25 MG/ML IJ SOLN
6.2500 mg | INTRAMUSCULAR | Status: DC | PRN
  Filled 2017-01-25: qty 1

## 2017-01-25 MED ORDER — PROPOFOL 10 MG/ML IV BOLUS
INTRAVENOUS | Status: DC | PRN
  Administered 2017-01-25: 170 mg via INTRAVENOUS

## 2017-01-25 MED ORDER — ACETAMINOPHEN 650 MG RE SUPP
650.0000 mg | RECTAL | Status: DC | PRN
  Filled 2017-01-25: qty 1

## 2017-01-25 MED ORDER — PROPOFOL 10 MG/ML IV BOLUS
INTRAVENOUS | Status: AC
  Filled 2017-01-25: qty 20

## 2017-01-25 SURGICAL SUPPLY — 42 items
BANDAGE CO FLEX L/F 2IN X 5YD (GAUZE/BANDAGES/DRESSINGS) ×3 IMPLANT
BANDAGE COBAN STERILE 2 (GAUZE/BANDAGES/DRESSINGS) ×2 IMPLANT
BANDAGE CONFORM 2X5YD N/S (GAUZE/BANDAGES/DRESSINGS) IMPLANT
BLADE SURG 15 STRL LF DISP TIS (BLADE) ×1 IMPLANT
BLADE SURG 15 STRL SS (BLADE) ×3
BNDG CONFORM 2 STRL LF (GAUZE/BANDAGES/DRESSINGS) ×2 IMPLANT
CLEANER CAUTERY TIP 5X5 PAD (MISCELLANEOUS) ×1 IMPLANT
COVER BACK TABLE 60X90IN (DRAPES) ×3 IMPLANT
COVER MAYO STAND STRL (DRAPES) ×3 IMPLANT
DRAPE LAPAROTOMY 100X72 PEDS (DRAPES) ×3 IMPLANT
ELECT NDL TIP 2.8 STRL (NEEDLE) IMPLANT
ELECT NEEDLE TIP 2.8 STRL (NEEDLE) IMPLANT
ELECT REM PT RETURN 9FT ADLT (ELECTROSURGICAL) ×3
ELECTRODE REM PT RTRN 9FT ADLT (ELECTROSURGICAL) ×1 IMPLANT
GAUZE SPONGE 4X4 8PLY STR LF (GAUZE/BANDAGES/DRESSINGS) ×2 IMPLANT
GAUZE XEROFORM 1X8 LF (GAUZE/BANDAGES/DRESSINGS) ×3 IMPLANT
GLOVE BIOGEL M 6.5 STRL (GLOVE) ×2 IMPLANT
GLOVE BIOGEL PI IND STRL 6.5 (GLOVE) IMPLANT
GLOVE BIOGEL PI IND STRL 7.0 (GLOVE) IMPLANT
GLOVE BIOGEL PI INDICATOR 6.5 (GLOVE) ×2
GLOVE BIOGEL PI INDICATOR 7.0 (GLOVE) ×4
GLOVE ECLIPSE 7.0 STRL STRAW (GLOVE) ×2 IMPLANT
GLOVE SURG SS PI 8.0 STRL IVOR (GLOVE) ×3 IMPLANT
GOWN STRL REUS W/ TWL LRG LVL3 (GOWN DISPOSABLE) ×1 IMPLANT
GOWN STRL REUS W/ TWL XL LVL3 (GOWN DISPOSABLE) ×1 IMPLANT
GOWN STRL REUS W/TWL LRG LVL3 (GOWN DISPOSABLE) ×2 IMPLANT
GOWN STRL REUS W/TWL XL LVL3 (GOWN DISPOSABLE) ×4 IMPLANT
KIT RM TURNOVER CYSTO AR (KITS) ×3 IMPLANT
MANIFOLD NEPTUNE II (INSTRUMENTS) IMPLANT
NDL HYPO 25X1 1.5 SAFETY (NEEDLE) ×1 IMPLANT
NEEDLE HYPO 25X1 1.5 SAFETY (NEEDLE) ×3 IMPLANT
NS IRRIG 500ML POUR BTL (IV SOLUTION) IMPLANT
PACK BASIN DAY SURGERY FS (CUSTOM PROCEDURE TRAY) ×3 IMPLANT
PAD CLEANER CAUTERY TIP 5X5 (MISCELLANEOUS) ×2
PENCIL BUTTON HOLSTER BLD 10FT (ELECTRODE) ×3 IMPLANT
SUT CHROMIC 4 0 PS 2 18 (SUTURE) ×6 IMPLANT
SYR CONTROL 10ML LL (SYRINGE) ×3 IMPLANT
TOWEL OR 17X24 6PK STRL BLUE (TOWEL DISPOSABLE) ×6 IMPLANT
TRAY DSU PREP LF (CUSTOM PROCEDURE TRAY) ×3 IMPLANT
TUBE CONNECTING 12'X1/4 (SUCTIONS)
TUBE CONNECTING 12X1/4 (SUCTIONS) IMPLANT
WATER STERILE IRR 500ML POUR (IV SOLUTION) IMPLANT

## 2017-01-25 NOTE — Op Note (Signed)
  Preoperative diagnosis: 1. Phimosis  Postoperative diagnosis:  1. Phimosis  Procedure:  1. Circumcision  Surgeon: Irine Seal. M.D.  Anesthesia: general  Complications: None  EBL: 31ml  Specimens: 1. Foreskin   Disposition of specimens: discarded   Indications:  This patient is to undergo circumcision for symptomatic phimosis.  Procedure:   The patient was placed in the supine position and anesthesia was induced.  The genitalia was prepped with betadine solution and he was draped in the usual sterile fashion.   A penile block was performed with 6cc of 0.25% marcaine.  The redundant foreskin was excised using the dorsal slit and wing resection technique and hemostasis was achieved with the Bovie.  The skin edges were reapproximated with a running 4-0 chromic that was tied at the quadrants.  The wound was cleaned and dried.  A dressing of Xeroform, Kling and Coban was applied.  The anesthetic was reversed and he was taken to the recovery room in stable condition.  There were no complications.

## 2017-01-25 NOTE — Interval H&P Note (Signed)
History and Physical Interval Note:  01/25/2017 10:11 AM  Tim Obrien  has presented today for surgery, with the diagnosis of PHIMOSIS  The various methods of treatment have been discussed with the patient and family. After consideration of risks, benefits and other options for treatment, the patient has consented to  Procedure(s): CIRCUMCISION ADULT (N/A) as a surgical intervention .  The patient's history has been reviewed, patient examined, no change in status, stable for surgery.  I have reviewed the patient's chart and labs.  Questions were answered to the patient's satisfaction.     [name] J

## 2017-01-25 NOTE — Anesthesia Preprocedure Evaluation (Addendum)
Anesthesia Evaluation  Patient identified by MRN, date of birth, ID band Patient awake    Reviewed: Allergy & Precautions, NPO status , Patient's Chart, lab work & pertinent test results  Airway Mallampati: I  TM Distance: >3 FB Neck ROM: Full    Dental  (+) Upper Dentures, Lower Dentures   Pulmonary COPD, Current Smoker,     + decreased breath sounds      Cardiovascular negative cardio ROS   Rhythm:Regular Rate:Normal     Neuro/Psych negative neurological ROS  negative psych ROS   GI/Hepatic negative GI ROS, Neg liver ROS,   Endo/Other  negative endocrine ROS  Renal/GU negative Renal ROS  negative genitourinary   Musculoskeletal negative musculoskeletal ROS (+)   Abdominal   Peds negative pediatric ROS (+)  Hematology negative hematology ROS (+)   Anesthesia Other Findings - HLD  Reproductive/Obstetrics negative OB ROS                            Anesthesia Physical Anesthesia Plan  ASA: III  Anesthesia Plan: General   Post-op Pain Management:    Induction: Intravenous  Airway Management Planned: LMA  Additional Equipment:   Intra-op Plan:   Post-operative Plan: Extubation in OR  Informed Consent: I have reviewed the patients History and Physical, chart, labs and discussed the procedure including the risks, benefits and alternatives for the proposed anesthesia with the patient or authorized representative who has indicated his/her understanding and acceptance.   Dental advisory given  Plan Discussed with: CRNA  Anesthesia Plan Comments:         Anesthesia Quick Evaluation

## 2017-01-25 NOTE — Transfer of Care (Signed)
Immediate Anesthesia Transfer of Care Note  Patient: Tim Obrien  Procedure(s) Performed: Procedure(s) (LRB): CIRCUMCISION ADULT (N/A)  Patient Location: PACU  Anesthesia Type: General  Level of Consciousness: awake, oriented, sedated and patient cooperative  Airway & Oxygen Therapy: Patient Spontanous Breathing and Patient connected to face mask oxygen  Post-op Assessment: Report given to PACU RN and Post -op Vital signs reviewed and stable  Post vital signs: Reviewed and stable  Complications: No apparent anesthesia complications

## 2017-01-25 NOTE — Anesthesia Procedure Notes (Signed)
Procedure Name: LMA Insertion Date/Time: 01/25/2017 10:29 AM Performed by: Denna Haggard D Pre-anesthesia Checklist: Patient identified, Emergency Drugs available, Suction available and Patient being monitored Patient Re-evaluated:Patient Re-evaluated prior to inductionOxygen Delivery Method: Circle system utilized Preoxygenation: Pre-oxygenation with 100% oxygen Intubation Type: IV induction Ventilation: Mask ventilation without difficulty LMA: LMA inserted LMA Size: 4.0 Number of attempts: 1 Airway Equipment and Method: Bite block Placement Confirmation: positive ETCO2 Tube secured with: Tape Dental Injury: Teeth and Oropharynx as per pre-operative assessment

## 2017-01-25 NOTE — Anesthesia Postprocedure Evaluation (Addendum)
Anesthesia Post Note  Patient: Tim Obrien  Procedure(s) Performed: Procedure(s) (LRB): CIRCUMCISION ADULT (N/A)  Patient location during evaluation: PACU Anesthesia Type: General Level of consciousness: awake and alert Pain management: pain level controlled Vital Signs Assessment: post-procedure vital signs reviewed and stable Respiratory status: spontaneous breathing, nonlabored ventilation, respiratory function stable and patient connected to nasal cannula oxygen Cardiovascular status: blood pressure returned to baseline and stable Postop Assessment: no signs of nausea or vomiting Anesthetic complications: no       Last Vitals:  Vitals:   01/25/17 1130 01/25/17 1140  BP: 117/66 129/66  Pulse: 70 71  Resp: 18 16  Temp:      Last Pain:  Vitals:   01/25/17 1113  TempSrc:   PainSc: [name]

## 2017-01-25 NOTE — Discharge Instructions (Addendum)
Circumcision-Home Care Instructions ° °The following instructions have been prepared to help you care for yourself upon your return home today.  ° °Wound Care & Hygiene:  ° °You may apply ice to the penis.  This may help to decrease swelling.  Remove the dressing tomorrow.  If the dressing falls off before then, leave it off.  You may shower or bathe in 48 hours  Gently wash the penis with soap and water.  The stitches do not need to be removed. ° °Activity: ° °Do not drive or operate any equipment today.  The effects of anesthesia are still present, drowsiness may result.  Rest today, not necessarily flat bed rest, just take it easy.  You may resume your normal activity in one to two days or as indicated by your physician. ° °Sexual Activity:  Erection and sexual relations should be avoided for *2 weeks. ° °Return to Work: ° °One to two days or as indicated by your physician . ° °Diet:  Drink liquids or eat a very light diet this evening.  You may resume a regular diet tomorrow. ° °General Expectations of your surgery: ° °· You may have a small amount of bleeding °· The penis will be swollen and bruised for approximately one week °· You may wake during the night with an erection, usually this is caused by having a full bladder so you should try to urinate (pass your water) to relieve the erection or apply ice to the penis ° °Unexpected Observations - Call your doctor if these occur! °· Persistent or heavy bleeding °· Temperature of 101 degrees or more °· Severe pain not relieved by medication ° ° ° °Post Anesthesia Home Care Instructions ° °Activity: °Get plenty of rest for the remainder of the day. A responsible individual must stay with you for 24 hours following the procedure.  °For the next 24 hours, DO NOT: °-Drive a car °-Operate machinery °-Drink alcoholic beverages °-Take any medication unless instructed by your physician °-Make any legal decisions or sign important papers. ° °Meals: °Start with liquid  foods such as gelatin or soup. Progress to regular foods as tolerated. Avoid greasy, spicy, heavy foods. If nausea and/or vomiting occur, drink only clear liquids until the nausea and/or vomiting subsides. Call your physician if vomiting continues. ° °Special Instructions/Symptoms: °Your throat may feel dry or sore from the anesthesia or the breathing tube placed in your throat during surgery. If this causes discomfort, gargle with warm salt water. The discomfort should disappear within 24 hours. ° °If you had a scopolamine patch placed behind your ear for the management of post- operative nausea and/or vomiting: ° °1. The medication in the patch is effective for 72 hours, after which it should be removed.  Wrap patch in a tissue and discard in the trash. Wash hands thoroughly with soap and water. °2. You may remove the patch earlier than 72 hours if you experience unpleasant side effects which may include dry mouth, dizziness or visual disturbances. °3. Avoid touching the patch. Wash your hands with soap and water after contact with the patch. °  ° ° ° ° ° ° ° °

## 2017-01-27 ENCOUNTER — Encounter (HOSPITAL_BASED_OUTPATIENT_CLINIC_OR_DEPARTMENT_OTHER): Payer: Self-pay | Admitting: Urology

## 2017-02-08 DIAGNOSIS — N471 Phimosis: Secondary | ICD-10-CM | POA: Diagnosis not present

## 2017-02-08 DIAGNOSIS — N476 Balanoposthitis: Secondary | ICD-10-CM | POA: Diagnosis not present

## 2017-02-25 NOTE — Addendum Note (Signed)
Addendum  created 02/25/17 1159 by Effie Berkshire, MD   Sign clinical note

## 2017-08-01 DIAGNOSIS — Z23 Encounter for immunization: Secondary | ICD-10-CM | POA: Diagnosis not present

## 2017-11-07 ENCOUNTER — Ambulatory Visit (INDEPENDENT_AMBULATORY_CARE_PROVIDER_SITE_OTHER): Payer: Medicare Other | Admitting: Physician Assistant

## 2017-11-07 ENCOUNTER — Encounter: Payer: Self-pay | Admitting: Physician Assistant

## 2017-11-07 ENCOUNTER — Other Ambulatory Visit: Payer: Self-pay

## 2017-11-07 VITALS — BP 123/67 | HR 72 | Temp 98.0°F | Resp 18 | Ht 69.21 in | Wt 150.0 lb

## 2017-11-07 DIAGNOSIS — J3489 Other specified disorders of nose and nasal sinuses: Secondary | ICD-10-CM | POA: Diagnosis not present

## 2017-11-07 DIAGNOSIS — J0100 Acute maxillary sinusitis, unspecified: Secondary | ICD-10-CM | POA: Diagnosis not present

## 2017-11-07 MED ORDER — IPRATROPIUM BROMIDE 0.03 % NA SOLN: 2.0000 | Freq: Two times a day (BID) | NASAL | 0 refills | Status: DC

## 2017-11-07 MED ORDER — AMOXICILLIN-POT CLAVULANATE 875-125 MG PO TABS: 1.0000 | ORAL_TABLET | Freq: Two times a day (BID) | ORAL | 0 refills | Status: AC

## 2017-11-07 NOTE — Progress Notes (Signed)
MRN: [id number] DOB: 1945/11/04  Subjective:   Tim Obrien is a 71 y.o. male presenting for chief complaint of Cough (X 1 week) and Facial Pain (X 1 week- pt states with headache and runny nose) .  Reports 1.5 week history of worsening sinus pressure (worse on right side), mucous drainage, and ear fullness. The drainage from sinuses is causing him to cough.  Has tried alka seltzer plus with no relief relief. Denies fever, ear pain, sore throat, wheezing, shortness of breath and chest pain, nausea, vomiting, abdominal pain and diarrhea. Has not had sick contact with anyone. Has history of seasonal allergies and COPD. No history of asthma. Patient has had flu shot this season. Smokes 1 ppd.   Tim Obrien has a current medication list which includes the following prescription(s): multiple vitamins-minerals, simvastatin, chlorphen-phenyleph-apap, clotrimazole, and hydrocodone-acetaminophen. Also is allergic to aspirin.  Tim Obrien  has a past medical history of Emphysema/COPD ([place]), Frequency of urination, Full dentures, Hyperlipidemia, Malignant neoplasm of colon ([place]) (ONCOLGIST-  DR SHERRILL--  LOV 2015-- PT IN CLINICAL REMISSION), Nocturia, Phimosis, and Seasonal allergies. Also  has a past surgical history that includes Wrist surgery (Left, 08/07/1966); PORT- A -CATH PLACEMENT (04/02/2009); Colonoscopy (last one 2017); Right colectomy (02/28/2009); PORTA CATH REMOVAL (05/2009); and Circumcision (N/A, 01/25/2017).   Objective:   Vitals: BP 123/67 (BP Location: Left Arm, Patient Position: Sitting, Cuff Size: Normal)   Pulse 72   Temp 98 F (36.7 C) (Oral)   Resp 18   Ht 5' 9.21" (1.758 m)   Wt 150 lb (68 kg)   SpO2 96%   BMI 22.01 kg/m   Physical Exam  Constitutional: He is oriented to person, place, and time. He appears well-developed and well-nourished. No distress.  HENT:  Head: Normocephalic and atraumatic.  Right Ear: Tympanic membrane is not erythematous and not bulging. A middle  ear effusion is present.  Left Ear: Tympanic membrane is not erythematous and not bulging. A middle ear effusion is present.  Nose: Mucosal edema present. Right sinus exhibits maxillary sinus tenderness. Right sinus exhibits no frontal sinus tenderness. Left sinus exhibits maxillary sinus tenderness. Left sinus exhibits no frontal sinus tenderness.  Mouth/Throat: Uvula is midline, oropharynx is clear and moist and mucous membranes are normal. No tonsillar exudate.  Eyes: Conjunctivae are normal.  Neck: Normal range of motion.  Cardiovascular: Normal rate, regular rhythm and normal heart sounds.  Pulmonary/Chest: Effort normal. He has no decreased breath sounds. He has no wheezes. He has no rhonchi. He has no rales.  Lymphadenopathy:       Head (right side): No submental, no submandibular, no tonsillar, no preauricular, no posterior auricular and no occipital adenopathy present.       Head (left side): No submental, no submandibular, no tonsillar, no preauricular, no posterior auricular and no occipital adenopathy present.    He has no cervical adenopathy.       Right: No supraclavicular adenopathy present.       Left: No supraclavicular adenopathy present.  Neurological: He is alert and oriented to person, place, and time.  Skin: Skin is warm and dry.  Psychiatric: He has a normal mood and affect.  Vitals reviewed.   No results found for this or any previous visit (from the past 24 hour(s)).  Assessment and Plan :  1. Sinus pressure 2. Rhinorrhea - ipratropium (ATROVENT) 0.03 % nasal spray; Place 2 sprays into both nostrils 2 (two) times daily.  Dispense: 30 mL; Refill: 0 3. Acute non-recurrent  maxillary sinusitis History and physical exam findings consistent with sinusitis.  Due to duration and worsening of symptoms, will treat for underlying bacterial infection at this time.  Patient encouraged to continue with symptomatic relief.  Advised to return to clinic if symptoms worsen, do not  improve, or as needed.  - amoxicillin-clavulanate (AUGMENTIN) 875-125 MG tablet; Take 1 tablet by mouth 2 (two) times daily for 7 days.  Dispense: 14 tablet; Refill: 0   Tenna Delaine, PA-C  [facility] 11/07/2017 5:40 PM

## 2017-11-07 NOTE — Patient Instructions (Addendum)
We will treat your sinus infection with an antibiotic at this time. I recommend continuing with symptomatic treatment. Use nasal saline rinses and over the counter ibuprofen as prescribed. I have given you a prescription nasal spray to use for runny nose at night if you need it. Return to clinic if symptoms worsen, do not improve, or as needed.  Sinusitis, Adult Sinusitis is soreness and inflammation of your sinuses. Sinuses are hollow spaces in the bones around your face. They are located:  Around your eyes.  In the middle of your forehead.  Behind your nose.  In your cheekbones.  Your sinuses and nasal passages are lined with a stringy fluid (mucus). Mucus normally drains out of your sinuses. When your nasal tissues get inflamed or swollen, the mucus can get trapped or blocked so air cannot flow through your sinuses. This lets bacteria, viruses, and funguses grow, and that leads to infection. Follow these instructions at home: Medicines  Take, use, or apply over-the-counter and prescription medicines only as told by your doctor. These may include nasal sprays.  If you were prescribed an antibiotic medicine, take it as told by your doctor. Do not stop taking the antibiotic even if you start to feel better. Hydrate and Humidify  Drink enough water to keep your pee (urine) clear or pale yellow.  Use a cool mist humidifier to keep the humidity level in your home above 50%.  Breathe in steam for 10-15 minutes, 3-4 times a day or as told by your doctor. You can do this in the bathroom while a hot shower is running.  Try not to spend time in cool or dry air. Rest  Rest as much as possible.  Sleep with your head raised (elevated).  Make sure to get enough sleep each night. General instructions  Put a warm, moist washcloth on your face 3-4 times a day or as told by your doctor. This will help with discomfort.  Wash your hands often with soap and water. If there is no soap and  water, use hand sanitizer.  Do not smoke. Avoid being around people who are smoking (secondhand smoke).  Keep all follow-up visits as told by your doctor. This is important. Contact a doctor if:  You have a fever.  Your symptoms get worse.  Your symptoms do not get better within 10 days. Get help right away if:  You have a very bad headache.  You cannot stop throwing up (vomiting).  You have pain or swelling around your face or eyes.  You have trouble seeing.  You feel confused.  Your neck is stiff.  You have trouble breathing. This information is not intended to replace advice given to you by your health care provider. Make sure you discuss any questions you have with your health care provider. Document Released: 03/01/2008 Document Revised: 05/09/2016 Document Reviewed: 07/09/2015 Elsevier Interactive Patient Education  2018 [employer].   IF you received an x-ray today, you will receive an invoice from [facility]. Please contact [facility] at [phone] with questions or concerns regarding your invoice.   IF you received labwork today, you will receive an invoice from [facility]. Please contact [facility] at [phone] with questions or concerns regarding your invoice.   Our billing staff will not be able to assist you with questions regarding bills from these companies.  You will be contacted with the lab results as soon as they are available. The fastest way to get your results is to activate your My  Chart account. Instructions are located on the last page of this paperwork. If you have not heard from [place] regarding the results in 2 weeks, please contact this office.

## 2018-07-05 ENCOUNTER — Encounter: Payer: Self-pay | Admitting: Physician Assistant

## 2018-07-05 ENCOUNTER — Ambulatory Visit (INDEPENDENT_AMBULATORY_CARE_PROVIDER_SITE_OTHER): Payer: Medicare Other | Admitting: Physician Assistant

## 2018-07-05 VITALS — BP 140/72 | HR 74 | Temp 98.0°F | Resp 17 | Ht 69.0 in | Wt 151.0 lb

## 2018-07-05 DIAGNOSIS — J302 Other seasonal allergic rhinitis: Secondary | ICD-10-CM | POA: Diagnosis not present

## 2018-07-05 DIAGNOSIS — J0100 Acute maxillary sinusitis, unspecified: Secondary | ICD-10-CM

## 2018-07-05 MED ORDER — IPRATROPIUM BROMIDE 0.03 % NA SOLN: 2.0000 | Freq: Two times a day (BID) | NASAL | 0 refills | Status: DC

## 2018-07-05 MED ORDER — AMOXICILLIN-POT CLAVULANATE 875-125 MG PO TABS: 1.0000 | ORAL_TABLET | Freq: Two times a day (BID) | ORAL | 0 refills | Status: AC

## 2018-07-05 MED ORDER — CETIRIZINE HCL 10 MG PO TABS: 5.0000 mg | ORAL_TABLET | Freq: Every day | ORAL | 0 refills | Status: DC

## 2018-07-05 NOTE — Patient Instructions (Addendum)
We will treat your sinus infection with an antibiotic at this time. I recommend continuing with symptomatic treatment. Use nasal saline rinses and over the counter ibuprofen as prescribed. I have given you a prescription nasal spray to use for runny nose at night if you need it. In the future, you may use half a tablet of zyrtec for seasonal allergies.  Return to clinic if symptoms worsen, do not improve, or as needed.   If you have lab work done today you will be contacted with your lab results within the next 2 weeks.  If you have not heard from [place] then please contact us. The fastest way to get your results is to register for My Chart.   Sinusitis, Adult Sinusitis is soreness and inflammation of your sinuses. Sinuses are hollow spaces in the bones around your face. They are located:  Around your eyes.  In the middle of your forehead.  Behind your nose.  In your cheekbones.  Your sinuses and nasal passages are lined with a stringy fluid (mucus). Mucus normally drains out of your sinuses. When your nasal tissues get inflamed or swollen, the mucus can get trapped or blocked so air cannot flow through your sinuses. This lets bacteria, viruses, and funguses grow, and that leads to infection. Follow these instructions at home: Medicines  Take, use, or apply over-the-counter and prescription medicines only as told by your doctor. These may include nasal sprays.  If you were prescribed an antibiotic medicine, take it as told by your doctor. Do not stop taking the antibiotic even if you start to feel better. Hydrate and Humidify  Drink enough water to keep your pee (urine) clear or pale yellow.  Use a cool mist humidifier to keep the humidity level in your home above 50%.  Breathe in steam for 10-15 minutes, 3-4 times a day or as told by your doctor. You can do this in the bathroom while a hot shower is running.  Try not to spend time in cool or dry air. Rest  Rest as much as  possible.  Sleep with your head raised (elevated).  Make sure to get enough sleep each night. General instructions  Put a warm, moist washcloth on your face 3-4 times a day or as told by your doctor. This will help with discomfort.  Wash your hands often with soap and water. If there is no soap and water, use hand sanitizer.  Do not smoke. Avoid being around people who are smoking (secondhand smoke).  Keep all follow-up visits as told by your doctor. This is important. Contact a doctor if:  You have a fever.  Your symptoms get worse.  Your symptoms do not get better within 10 days. Get help right away if:  You have a very bad headache.  You cannot stop throwing up (vomiting).  You have pain or swelling around your face or eyes.  You have trouble seeing.  You feel confused.  Your neck is stiff.  You have trouble breathing. This information is not intended to replace advice given to you by your health care provider. Make sure you discuss any questions you have with your health care provider. Document Released: 03/01/2008 Document Revised: 05/09/2016 Document Reviewed: 07/09/2015 Elsevier Interactive Patient Education  2018 [employer].  IF you received an x-ray today, you will receive an invoice from [facility]. Please contact [facility] at [phone] with questions or concerns regarding your invoice.   IF you received labwork today, you will receive  an Pharmacologist from [facility]. Please contact [facility] at [phone] with questions or concerns regarding your invoice.   Our billing staff will not be able to assist you with questions regarding bills from these companies.  You will be contacted with the lab results as soon as they are available. The fastest way to get your results is to activate your My Chart account. Instructions are located on the last page of this paperwork. If you have not heard from [place] regarding the results in 2 weeks, please  contact this office.

## 2018-07-05 NOTE — Progress Notes (Signed)
MRN: [id number] DOB: 12/05/1945  Subjective:   Tim Obrien is a 72 y.o. male presenting for chief complaint of Cough and Sinus Problem .  Reports 3 week history of b/l sinus pressure, rhinorrhea, congestion, and facial pain. Having mucous drainage from nose. Post nasal drip is leading to a dry cough. Denies fever, chills, diaphoresis, ear pain, wheezing, shortness of breath, chest tightness, chest pain and myalgia, nausea, vomiting, abdominal pain and diarrhea. Has tried tylenol sinus tablets and alka seltzer with no relief. Doing nasal saline rinses nightly.  Has not had sick contact with anyone. Has history of seasonal allergies but not taking anything. PMH of COPD. No PMH of asthma. Patient has had flu shot this season. Smokes 1ppd. Denies any other aggravating or relieving factors, no other questions or concerns.  Review of Systems  Cardiovascular: Negative for chest pain, palpitations and leg swelling.  Gastrointestinal: Negative for abdominal pain.  Genitourinary: Negative for hematuria.  Neurological: Negative for dizziness and headaches.    Tim Obrien has a current medication list which includes the following prescription(s): multiple vitamins-minerals, simvastatin, amoxicillin-clavulanate, cetirizine, and ipratropium. Also is allergic to aspirin.  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 Also  has a past surgical history that includes Wrist surgery (Left, 08/07/1966); PORT- A -CATH PLACEMENT (04/02/2009); Colonoscopy (last one 2017); Right colectomy (02/28/2009); PORTA CATH REMOVAL (05/2009); and Circumcision (N/A, 01/25/2017).   Objective:   Vitals: BP 140/72 (BP Location: Left Arm, Cuff Size: Normal)   Pulse 74   Temp 98 F (36.7 C) (Oral)   Resp 17   Ht 5'  9" (1.753 m)   Wt 151 lb (68.5 kg)   SpO2 98%   BMI 22.30 kg/m   Physical Exam  Constitutional: He is oriented to person, place, and time. He appears well-developed and well-nourished. No distress.  Appears older than stated age.   HENT:  Head: Normocephalic and atraumatic.  Right Ear: External ear and ear canal normal.  Left Ear: External ear and ear canal normal.  Nose: Mucosal edema and rhinorrhea present. Right sinus exhibits maxillary sinus tenderness. Right sinus exhibits no frontal sinus tenderness. Left sinus exhibits maxillary sinus tenderness. Left sinus exhibits no frontal sinus tenderness.  Mouth/Throat: Uvula is midline and mucous membranes are normal. No posterior oropharyngeal edema, posterior oropharyngeal erythema or tonsillar abscesses. No tonsillar exudate.  Bilateral ear canals impacted with copious amount of yellow brown dry cerumen, unable to visualize TMs.   Eyes: Conjunctivae are normal.  Neck: Normal range of motion.  Cardiovascular: Normal rate, regular rhythm, normal heart sounds and intact distal pulses.  Pulmonary/Chest: Effort normal and breath sounds normal. No accessory muscle usage. No respiratory distress. He has no decreased breath sounds. He has no wheezes. He has no rhonchi. He has no rales.  Lymphadenopathy:       Head (right side): No submental, no submandibular, no tonsillar, no preauricular, no posterior auricular and no occipital adenopathy present.       Head (left side): No submental, no submandibular, no tonsillar, no preauricular, no posterior auricular and no occipital adenopathy present.    He has no cervical adenopathy.       Right: No supraclavicular adenopathy present.       Left: No supraclavicular adenopathy present.  Neurological: He is alert and oriented to person, place,  and time.  Skin: Skin is warm and dry.  Psychiatric: He has a normal mood and affect.  Vitals reviewed.    BP Readings from Last 3 Encounters:  07/05/18 140/72   11/07/17 123/67  01/25/17 131/62     No results found for this or any previous visit (from the past 24 hour(s)).  Assessment and Plan :  1. Acute maxillary sinusitis, recurrence not specified History and physical exam findings consistent with sinusitis. Due to duration and no improvement with conservative management, will treat with abx at this time. Lungs CTAB.  Patient encouraged to continue with symptomatic relief.  Advised to return to clinic if symptoms worsen, do not improve, or as needed.  - amoxicillin-clavulanate (AUGMENTIN) 875-125 MG tablet; Take 1 tablet by mouth 2 (two) times daily for 7 days.  Dispense: 14 tablet; Refill: 0 - ipratropium (ATROVENT) 0.03 % nasal spray; Place 2 sprays into both nostrils 2 (two) times daily.  Dispense: 30 mL; Refill: 0  2. Seasonal allergies Recommend daily antihistamine for seasonal allergies.  - cetirizine (ZYRTEC) 10 MG tablet; Take 0.5 tablets (5 mg total) by mouth daily.  Dispense: 30 tablet; Refill: 0   Tim Delaine, PA-C  [facility] 07/05/2018 3:17 PM

## 2018-07-26 ENCOUNTER — Encounter: Payer: Self-pay | Admitting: Gastroenterology

## 2018-07-27 ENCOUNTER — Other Ambulatory Visit: Payer: Self-pay | Admitting: Physician Assistant

## 2018-07-27 DIAGNOSIS — J0100 Acute maxillary sinusitis, unspecified: Secondary | ICD-10-CM

## 2018-07-27 NOTE — Telephone Encounter (Signed)
Requested medication (s) are due for refill today: yes  Requested medication (s) are on the active medication list: yes    Last refill: 07/05/18 30 cc  0 refills  Future visit scheduled no  Notes to clinic:off protocol  Requested Prescriptions  Pending Prescriptions Disp Refills   ipratropium (ATROVENT) 0.03 % nasal spray [Pharmacy Med Name: IPRATROPIUM 0.03% SPRAY]  0    Sig: Place 2 sprays into both nostrils 2 (two) times daily.     Off-Protocol Failed - 07/27/2018  4:21 AM      Failed - Medication not assigned to a protocol, review manually.      Passed - Valid encounter within last 12 months    Recent Outpatient Visits          3 weeks ago Acute maxillary sinusitis, recurrence not specified   [facility] [place], [place] D, PA-C   8 months ago Sinus pressure   [facility], [place] D, PA-C   1 year ago Phimosis   [facility], [place] D, PA-C   1 year ago Phimosis   [facility] [place], [place] D, PA-C   1 year ago Cough   [facility], Ranell Patrick, MD

## 2018-10-06 ENCOUNTER — Ambulatory Visit (AMBULATORY_SURGERY_CENTER): Payer: Self-pay | Admitting: *Deleted

## 2018-10-06 VITALS — Ht 68.0 in | Wt 150.0 lb

## 2018-10-06 DIAGNOSIS — Z85038 Personal history of other malignant neoplasm of large intestine: Secondary | ICD-10-CM

## 2018-10-06 MED ORDER — PEG 3350-KCL-NA BICARB-NACL 420 G PO SOLR: 4000.0000 mL | Freq: Once | ORAL | 0 refills | Status: AC

## 2018-10-06 NOTE — Progress Notes (Signed)
Patient denies any allergies to eggs or soy. Patient denies any problems with anesthesia/sedation. Patient denies any oxygen use at home. Patient denies taking any diet/weight loss medications or blood thinners. EMMI education offered, pt declined.  

## 2018-10-20 ENCOUNTER — Ambulatory Visit (AMBULATORY_SURGERY_CENTER): Payer: Medicare Other | Admitting: Gastroenterology

## 2018-10-20 ENCOUNTER — Encounter: Payer: Self-pay | Admitting: Gastroenterology

## 2018-10-20 VITALS — BP 133/79 | HR 63 | Temp 98.4°F | Resp 17 | Ht 69.0 in | Wt 151.0 lb

## 2018-10-20 DIAGNOSIS — D123 Benign neoplasm of transverse colon: Secondary | ICD-10-CM

## 2018-10-20 DIAGNOSIS — Z86010 Personal history of colonic polyps: Secondary | ICD-10-CM | POA: Diagnosis not present

## 2018-10-20 DIAGNOSIS — Z85038 Personal history of other malignant neoplasm of large intestine: Secondary | ICD-10-CM

## 2018-10-20 DIAGNOSIS — K635 Polyp of colon: Secondary | ICD-10-CM | POA: Diagnosis not present

## 2018-10-20 DIAGNOSIS — J449 Chronic obstructive pulmonary disease, unspecified: Secondary | ICD-10-CM | POA: Diagnosis not present

## 2018-10-20 DIAGNOSIS — D124 Benign neoplasm of descending colon: Secondary | ICD-10-CM

## 2018-10-20 DIAGNOSIS — D127 Benign neoplasm of rectosigmoid junction: Secondary | ICD-10-CM

## 2018-10-20 MED ORDER — SODIUM CHLORIDE 0.9 % IV SOLN: 500.0000 mL | Freq: Once | INTRAVENOUS | Status: AC

## 2018-10-20 NOTE — Op Note (Signed)
Tim Obrien Patient Name: Tim Obrien Procedure Date: 10/20/2018 10:45 AM MRN: [id number] Endoscopist: Milus Banister , MD Age: 73 Referring MD:  Date of Birth: [birth year] Gender: Male Account #: [id number] Procedure:                Colonoscopy Indications:              High risk colon cancer surveillance: Personal                            history of colon cancer; T4 node +, right sided                            colon cancer resected June 2010 by Dr. Donne Hazel;                            chemo completed December 2010. Colonoscopy Tim Obrien                            02/2010 found numerous left sided hyperplastic                            polyps, sampled. Colonoscopy 2014 no adenomatous                            polyps, multiple left sided HP appearing polyps                            again noted. Medicines:                Monitored Anesthesia Care Procedure:                Pre-Anesthesia Assessment:                           - Prior to the procedure, a History and Physical                            was performed, and patient medications and                            allergies were reviewed. The patient's tolerance of                            previous anesthesia was also reviewed. The risks                            and benefits of the procedure and the sedation                            options and risks were discussed with the patient.                            All questions were answered, and informed consent  was obtained. Prior Anticoagulants: The patient has                            taken no previous anticoagulant or antiplatelet                            agents. ASA Grade Assessment: II - A patient with                            mild systemic disease. After reviewing the risks                            and benefits, the patient was deemed in                            satisfactory condition to undergo the procedure.                  After obtaining informed consent, the colonoscope                            was passed under direct vision. Throughout the                            procedure, the patient's blood pressure, pulse, and                            oxygen saturations were monitored continuously. The                            Model CF-HQ190L [id number]) scope was introduced                            through the anus and advanced to the the                            ileocolonic anastomosis. The colonoscopy was                            performed without difficulty. The patient tolerated                            the procedure well. The quality of the bowel                            preparation was good. The rectum was photographed. Scope In: 10:54:28 AM Scope Out: 11:11:00 AM Scope Withdrawal Time: 0 hours 13 minutes 20 seconds  Total Procedure Duration: 0 hours 16 minutes 32 seconds  Findings:                 Normal appearing right sided ileocolonic                            anastomosis.  Four sessile polyps were found in the descending                            colon and transverse colon. The polyps were 2 to 4                            mm in size. These polyps were removed with a cold                            snare. Resection and retrieval were complete.                           Multiple subcentimeter hyperplastic appearing                            rectosigmoid polyps, these were sampled with                            forceps.                           The exam was otherwise without abnormality on                            direct and retroflexion views. Complications:            No immediate complications. Estimated blood loss:                            None. Estimated Blood Loss:     Estimated blood loss: none. Impression:               - Normal right sided ileocolonic anastomosis.                           - Four 2 to 4 mm polyps in the  descending colon and                            in the transverse colon, removed with a cold snare.                            Resected and retrieved.                           - Multiple subcentimeter hyperplastic appearing                            rectosigmoid polyps, these were sampled with                            forceps.                           - The examination was otherwise normal on direct  and retroflexion views. Recommendation:           - Patient has a contact number available for                            emergencies. The signs and symptoms of potential                            delayed complications were discussed with the                            patient. Return to normal activities tomorrow.                            Written discharge instructions were provided to the                            patient.                           - Resume previous diet.                           - Continue present medications.                           You will receive a letter within 2-3 weeks with the                            pathology results and my final recommendations.                           If the polyp(s) is proven to be 'pre-cancerous' on                            pathology, you will need repeat colonoscopy in 3-5                            years. Milus Banister, MD 10/20/2018 11:18:42 AM This report has been signed electronically.

## 2018-10-20 NOTE — Patient Instructions (Signed)
5 polyps today.  4 removed and 1 biopsied. Repeat colonoscopy in 3-5 years.  YOU HAD AN ENDOSCOPIC PROCEDURE TODAY AT [facility]:   Refer to the procedure report that was given to you for any specific questions about what was found during the examination.  If the procedure report does not answer your questions, please call your gastroenterologist to clarify.  If you requested that your care partner not be given the details of your procedure findings, then the procedure report has been included in a sealed envelope for you to review at your convenience later.  YOU SHOULD EXPECT: Some feelings of bloating in the abdomen. Passage of more gas than usual.  Walking can help get rid of the air that was put into your GI tract during the procedure and reduce the bloating. If you had a lower endoscopy (such as a colonoscopy or flexible sigmoidoscopy) you may notice spotting of blood in your stool or on the toilet paper. If you underwent a bowel prep for your procedure, you may not have a normal bowel movement for a few days.  Please Note:  You might notice some irritation and congestion in your nose or some drainage.  This is from the oxygen used during your procedure.  There is no need for concern and it should clear up in a day or so.  SYMPTOMS TO REPORT IMMEDIATELY:   Following lower endoscopy (colonoscopy or flexible sigmoidoscopy):  Excessive amounts of blood in the stool  Significant tenderness or worsening of abdominal pains  Swelling of the abdomen that is new, acute  Fever of 100F or higher   For urgent or emergent issues, a gastroenterologist can be reached at any hour by calling [phone].   DIET:  We do recommend a small meal at first, but then you may proceed to your regular diet.  Drink plenty of fluids but you should avoid alcoholic beverages for 24 hours.  ACTIVITY:  You should plan to take it easy for the rest of today and you should NOT DRIVE or use heavy  machinery until tomorrow (because of the sedation medicines used during the test).    FOLLOW UP: Our staff will call the number listed on your records the next business day following your procedure to check on you and address any questions or concerns that you may have regarding the information given to you following your procedure. If we do not reach you, we will leave a message.  However, if you are feeling well and you are not experiencing any problems, there is no need to return our call.  We will assume that you have returned to your regular daily activities without incident.  If any biopsies were taken you will be contacted by phone or by letter within the next 1-3 weeks.  Please call us at [phone] if you have not heard about the biopsies in 3 weeks.    SIGNATURES/CONFIDENTIALITY: You and/or your care partner have signed paperwork which will be entered into your electronic medical record.  These signatures attest to the fact that that the information above on your After Visit Summary has been reviewed and is understood.  Full responsibility of the confidentiality of this discharge information lies with you and/or your care-partner.

## 2018-10-20 NOTE — Progress Notes (Signed)
Report given to PACU, vss 

## 2018-10-20 NOTE — Progress Notes (Signed)
Pt's states no medical or surgical changes since previsit or office visit. 

## 2018-10-20 NOTE — Progress Notes (Signed)
Called to room to assist during endoscopic procedure.  Patient ID and intended procedure confirmed with present staff. Received instructions for my participation in the procedure from the performing physician.  

## 2018-10-23 ENCOUNTER — Telehealth: Payer: Self-pay

## 2018-10-23 NOTE — Telephone Encounter (Signed)
  Follow up Call-  Call back number 10/20/2018  Post procedure Call Back phone  # [phone]  Permission to leave phone message Yes  Some recent data might be hidden     Patient questions:  Do you have a fever, pain , or abdominal swelling? No. Pain Score  0 *  Have you tolerated food without any problems? Yes.    Have you been able to return to your normal activities? Yes.    Do you have any questions about your discharge instructions: Diet   No. Medications  No. Follow up visit  No.  Do you have questions or concerns about your Care? No.  Actions: * If pain score is 4 or above: No action needed, pain <4.  No problems noted per pt. maw

## 2018-10-30 ENCOUNTER — Encounter: Payer: Self-pay | Admitting: Gastroenterology

## 2018-11-06 ENCOUNTER — Encounter: Payer: Self-pay | Admitting: Family Medicine

## 2018-11-06 ENCOUNTER — Other Ambulatory Visit: Payer: Self-pay

## 2018-11-06 ENCOUNTER — Ambulatory Visit (INDEPENDENT_AMBULATORY_CARE_PROVIDER_SITE_OTHER): Payer: Medicare Other | Admitting: Family Medicine

## 2018-11-06 VITALS — BP 122/78 | HR 81 | Temp 98.0°F | Resp 16 | Ht 69.0 in | Wt 150.0 lb

## 2018-11-06 DIAGNOSIS — F17200 Nicotine dependence, unspecified, uncomplicated: Secondary | ICD-10-CM

## 2018-11-06 DIAGNOSIS — L989 Disorder of the skin and subcutaneous tissue, unspecified: Secondary | ICD-10-CM

## 2018-11-06 DIAGNOSIS — Z1283 Encounter for screening for malignant neoplasm of skin: Secondary | ICD-10-CM | POA: Diagnosis not present
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
## 2018-11-06 NOTE — Patient Instructions (Signed)
° ° ° °  If you have lab work done today you will be contacted with your lab results within the next 2 weeks.  If you have not heard from us then please contact us. The fastest way to get your results is to register for My Chart. ° ° °IF you received an x-ray today, you will receive an invoice from [facility]. Please contact [facility] at [phone] with questions or concerns regarding your invoice.  ° °IF you received labwork today, you will receive an invoice from [facility]. Please contact [facility] at [phone] with questions or concerns regarding your invoice.  ° °Our billing staff will not be able to assist you with questions regarding bills from these companies. ° °You will be contacted with the lab results as soon as they are available. The fastest way to get your results is to activate your My Chart account. Instructions are located on the last page of this paperwork. If you have not heard from us regarding the results in 2 weeks, please contact this office. °  ° ° ° °

## 2018-12-11 ENCOUNTER — Other Ambulatory Visit: Payer: Self-pay | Admitting: Dermatology

## 2018-12-11 DIAGNOSIS — L720 Epidermal cyst: Secondary | ICD-10-CM | POA: Diagnosis not present

## 2018-12-11 DIAGNOSIS — L821 Other seborrheic keratosis: Secondary | ICD-10-CM | POA: Diagnosis not present

## 2018-12-11 DIAGNOSIS — D229 Melanocytic nevi, unspecified: Secondary | ICD-10-CM | POA: Diagnosis not present

## 2018-12-11 DIAGNOSIS — D485 Neoplasm of uncertain behavior of skin: Secondary | ICD-10-CM | POA: Diagnosis not present

## 2019-08-07 ENCOUNTER — Telehealth: Payer: Self-pay | Admitting: *Deleted

## 2019-08-07 NOTE — Telephone Encounter (Signed)
Schedule AWV.  

## 2019-12-18 ENCOUNTER — Other Ambulatory Visit: Payer: Self-pay

## 2019-12-18 ENCOUNTER — Telehealth (INDEPENDENT_AMBULATORY_CARE_PROVIDER_SITE_OTHER): Payer: Medicare Other | Admitting: Registered Nurse

## 2019-12-18 ENCOUNTER — Encounter: Payer: Self-pay | Admitting: Registered Nurse

## 2019-12-18 DIAGNOSIS — J302 Other seasonal allergic rhinitis: Secondary | ICD-10-CM

## 2019-12-18 DIAGNOSIS — J0100 Acute maxillary sinusitis, unspecified: Secondary | ICD-10-CM

## 2019-12-18 MED ORDER — AMOXICILLIN-POT CLAVULANATE 875-125 MG PO TABS: 1.0000 | ORAL_TABLET | Freq: Two times a day (BID) | ORAL | 0 refills | Status: AC

## 2019-12-18 MED ORDER — IPRATROPIUM BROMIDE 0.03 % NA SOLN: 2.0000 | Freq: Two times a day (BID) | NASAL | 12 refills | Status: AC

## 2019-12-18 MED ORDER — CETIRIZINE HCL 10 MG PO TABS: 5.0000 mg | ORAL_TABLET | Freq: Every day | ORAL | 11 refills | Status: AC

## 2019-12-18 NOTE — Patient Instructions (Signed)
° ° ° °  If you have lab work done today you will be contacted with your lab results within the next 2 weeks.  If you have not heard from us then please contact us. The fastest way to get your results is to register for My Chart. ° ° °IF you received an x-ray today, you will receive an invoice from [facility]. Please contact [facility] at [phone] with questions or concerns regarding your invoice.  ° °IF you received labwork today, you will receive an invoice from [facility]. Please contact [facility] at [phone] with questions or concerns regarding your invoice.  ° °Our billing staff will not be able to assist you with questions regarding bills from these companies. ° °You will be contacted with the lab results as soon as they are available. The fastest way to get your results is to activate your My Chart account. Instructions are located on the last page of this paperwork. If you have not heard from us regarding the results in 2 weeks, please contact this office. °  ° ° ° °

## 2020-01-09 NOTE — Progress Notes (Signed)
Telemedicine Encounter- SOAP NOTE Established Patient  This telephone encounter was conducted with the patient's (or proxy's) verbal consent via audio telecommunications: yes  Patient was instructed to have this encounter in a suitably private space; and to only have persons present to whom they give permission to participate. In addition, patient identity was confirmed by use of name plus two identifiers (DOB and address).  I discussed the limitations, risks, security and privacy concerns of performing an evaluation and management service by telephone and the availability of in person appointments. I also discussed with the patient that there may be a patient responsible charge related to this service. The patient expressed understanding and agreed to proceed.  I spent a total of 13 minutes talking with the patient or their proxy.  Chief Complaint  Patient presents with  . Sinus Problem    patient states he has been coughing up phlegm since the 12/08/2019 , runny nose , and he has a headache behind the eyes. he states he usually gets this every year    Subjective   Tim Obrien is a 74 y.o. established patient. Telephone visit today for URI  HPI Sxs ongoing for almost 2 weeks. Usually happens spring every year - often self limited, this time, carrying on longer. Discolored mucus. Rhinorrhea. Sinus headache in sphenoid and frontal.  No cough, shob, doe, sensory changes, fever, sick contacts, nvd.   Otherwise no concerns.   Patient Active Problem List   Diagnosis Date Noted  . Left ear pain 01/01/2016  . Cough 01/01/2016  . Malignant neoplasm of colon ([place]) 08/31/2011    Past Medical History:  Diagnosis Date  . Emphysema/COPD ([place])   . Frequency of urination   . Full dentures   . Hyperlipidemia   . Malignant neoplasm of colon ([place]) ONCOLGIST-  DR SHERRILL--  LOV 2015-- PT IN CLINICAL REMISSION   DX 2010--  STAGE IIIC (T4, N2)  S/P  RIGHT COLECTOMY AND COMPLETED  CHEMOTHERAPY  . Nocturia   . Phimosis   . Seasonal allergies     Current Outpatient Medications  Medication Sig Dispense Refill  . Multiple Vitamins-Minerals (CENTRUM SILVER PO) Take 1 tablet by mouth every morning.     . simvastatin (ZOCOR) 40 MG tablet Take 20 mg by mouth every evening.     Marland Kitchen amoxicillin-clavulanate (AUGMENTIN) 875-125 MG tablet Take 1 tablet by mouth 2 (two) times daily. 14 tablet 0  . cetirizine (ZYRTEC) 10 MG tablet Take 0.5 tablets (5 mg total) by mouth daily. 30 tablet 11  . ipratropium (ATROVENT) 0.03 % nasal spray Place 2 sprays into both nostrils every 12 (twelve) hours. 30 mL 12   Current Facility-Administered Medications  Medication Dose Route Frequency Provider Last Rate Last Admin  . 0.9 %  sodium chloride infusion  500 mL Intravenous Once Milus Banister, MD        Allergies  Allergen Reactions  . Aspirin Other (See Comments)    "upset stomach"  Even coated asa    Social History   Socioeconomic History  . Marital status: Widowed    Spouse name: Not on file  . Number of children: 1  . Years of education: Not on file  . Highest education level: Not on file  Occupational History  . Not on file  Tobacco Use  . Smoking status: Current Every Day Smoker    Packs/day: 1.00    Years: 57.00    Pack years: 57.00    Types: Cigarettes  .  Smokeless tobacco: Never Used  Substance and Sexual Activity  . Alcohol use: Yes    Alcohol/week: 1.0 standard drinks    Types: 1 Standard drinks or equivalent per week    Comment: occ.  . Drug use: No  . Sexual activity: Not on file  Other Topics Concern  . Not on file  Social History Narrative   Lives alone.   Wife died 2004-12-29 of ovarian cancer.   Social Determinants of Health   Financial Resource Strain:   . Difficulty of Paying Living Expenses:   Food Insecurity:   . Worried About Charity fundraiser in the Last Year:   . Arboriculturist in the Last Year:   Transportation Needs:   . Lexicographer (Medical):   Marland Kitchen Lack of Transportation (Non-Medical):   Physical Activity:   . Days of Exercise per Week:   . Minutes of Exercise per Session:   Stress:   . Feeling of Stress :   Social Connections:   . Frequency of Communication with Friends and Family:   . Frequency of Social Gatherings with Friends and Family:   . Attends Religious Services:   . Active Member of Clubs or Organizations:   . Attends Archivist Meetings:   Marland Kitchen Marital Status:   Intimate Partner Violence:   . Fear of Current or Ex-Partner:   . Emotionally Abused:   Marland Kitchen Physically Abused:   . Sexually Abused:     Review of Systems  Constitutional: Negative.  Negative for fever.  HENT: Positive for congestion and sinus pain. Negative for ear discharge, ear pain, hearing loss, nosebleeds, sore throat and tinnitus.   Eyes: Negative.   Respiratory: Negative.  Negative for cough, shortness of breath and stridor.   Cardiovascular: Negative.   Gastrointestinal: Negative.   Genitourinary: Negative.   Musculoskeletal: Negative.   Skin: Negative.   Neurological: Negative.   Endo/Heme/Allergies: Negative.   Psychiatric/Behavioral: Negative.   All other systems reviewed and are negative.   Objective   Vitals as reported by the patient: There were no vitals filed for this visit.  Tim Obrien was seen today for sinus problem.  Diagnoses and all orders for this visit:  Acute maxillary sinusitis, recurrence not specified -     amoxicillin-clavulanate (AUGMENTIN) 875-125 MG tablet; Take 1 tablet by mouth 2 (two) times daily. -     ipratropium (ATROVENT) 0.03 % nasal spray; Place 2 sprays into both nostrils every 12 (twelve) hours.  Seasonal allergies -     cetirizine (ZYRTEC) 10 MG tablet; Take 0.5 tablets (5 mg total) by mouth daily.   PLAN  tx ABRS with seasonal allergies - cetirizine, augmentin, and atorvent nasal spray  If no relief, return to clinic, will consider alternative allergy  agents  Patient encouraged to call clinic with any questions, comments, or concerns.   I discussed the assessment and treatment plan with the patient. The patient was provided an opportunity to ask questions and all were answered. The patient agreed with the plan and demonstrated an understanding of the instructions.   The patient was advised to call back or seek an in-person evaluation if the symptoms worsen or if the condition fails to improve as anticipated.  I provided 14 minutes of non-face-to-face time during this encounter.  Maximiano Coss, NP  Primary Care at [facility]

## 2020-09-25 ENCOUNTER — Telehealth: Payer: Self-pay | Admitting: *Deleted

## 2020-09-25 NOTE — Telephone Encounter (Signed)
No voicemail  Schedule AWV 

## 2021-07-15 DIAGNOSIS — Z23 Encounter for immunization: Secondary | ICD-10-CM | POA: Diagnosis not present
